# Patient Record
Sex: Male | Born: 1945 | Race: White | Hispanic: No | Marital: Married | Smoking: Former smoker
Health system: Southern US, Community
[De-identification: ages and names within clinical notes are randomized; demographics above are authoritative.]

## PROBLEM LIST (undated history)

## (undated) DIAGNOSIS — J42 Unspecified chronic bronchitis: Secondary | ICD-10-CM

## (undated) DIAGNOSIS — K589 Irritable bowel syndrome without diarrhea: Secondary | ICD-10-CM

## (undated) DIAGNOSIS — H9190 Unspecified hearing loss, unspecified ear: Secondary | ICD-10-CM

## (undated) DIAGNOSIS — K649 Unspecified hemorrhoids: Secondary | ICD-10-CM

## (undated) DIAGNOSIS — E785 Hyperlipidemia, unspecified: Secondary | ICD-10-CM

## (undated) DIAGNOSIS — M1990 Unspecified osteoarthritis, unspecified site: Secondary | ICD-10-CM

## (undated) DIAGNOSIS — Z860100 Personal history of colon polyps, unspecified: Secondary | ICD-10-CM

## (undated) DIAGNOSIS — R972 Elevated prostate specific antigen [PSA]: Secondary | ICD-10-CM

## (undated) DIAGNOSIS — I219 Acute myocardial infarction, unspecified: Secondary | ICD-10-CM

## (undated) DIAGNOSIS — J45909 Unspecified asthma, uncomplicated: Secondary | ICD-10-CM

## (undated) DIAGNOSIS — I10 Essential (primary) hypertension: Secondary | ICD-10-CM

## (undated) DIAGNOSIS — N400 Enlarged prostate without lower urinary tract symptoms: Secondary | ICD-10-CM

## (undated) DIAGNOSIS — T8859XA Other complications of anesthesia, initial encounter: Secondary | ICD-10-CM

## (undated) DIAGNOSIS — Z7902 Long term (current) use of antithrombotics/antiplatelets: Secondary | ICD-10-CM

## (undated) HISTORY — PX: CORONARY ANGIOPLASTY WITH STENT PLACEMENT: SHX49

## (undated) HISTORY — PX: BLEPHAROPLASTY: SUR158

## (undated) HISTORY — DX: Irritable bowel syndrome, unspecified: K58.9

## (undated) HISTORY — PX: TONSILLECTOMY: SUR1361

## (undated) HISTORY — DX: Unspecified hearing loss, unspecified ear: H91.90

## (undated) HISTORY — DX: Personal history of colon polyps, unspecified: Z86.0100

## (undated) HISTORY — DX: Personal history of colonic polyps: Z86.010

## (undated) HISTORY — PX: HEMORRHOID SURGERY: SHX153

## (undated) HISTORY — PX: COLONOSCOPY: SHX174

## (undated) HISTORY — DX: Unspecified asthma, uncomplicated: J45.909

---
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
## 1993-08-31 DIAGNOSIS — J45909 Unspecified asthma, uncomplicated: Secondary | ICD-10-CM

## 1993-08-31 HISTORY — DX: Unspecified asthma, uncomplicated: J45.909

## 2007-01-11 ENCOUNTER — Ambulatory Visit: Payer: Self-pay | Admitting: General Surgery

## 2008-03-29 ENCOUNTER — Ambulatory Visit: Payer: Self-pay | Admitting: Internal Medicine

## 2008-11-06 ENCOUNTER — Ambulatory Visit: Payer: Self-pay | Admitting: Internal Medicine

## 2008-11-07 ENCOUNTER — Ambulatory Visit: Payer: Self-pay | Admitting: Internal Medicine

## 2010-02-11 ENCOUNTER — Ambulatory Visit: Payer: Self-pay | Admitting: General Surgery

## 2010-08-31 HISTORY — PX: HEMORRHOID SURGERY: SHX153

## 2012-08-31 DIAGNOSIS — K631 Perforation of intestine (nontraumatic): Secondary | ICD-10-CM

## 2012-08-31 HISTORY — DX: Perforation of intestine (nontraumatic): K63.1

## 2013-01-02 ENCOUNTER — Encounter: Payer: Self-pay | Admitting: *Deleted

## 2013-02-23 ENCOUNTER — Ambulatory Visit: Payer: Self-pay | Admitting: General Surgery

## 2013-03-02 ENCOUNTER — Encounter: Payer: Self-pay | Admitting: *Deleted

## 2013-03-09 ENCOUNTER — Ambulatory Visit (INDEPENDENT_AMBULATORY_CARE_PROVIDER_SITE_OTHER): Payer: Medicare Other | Admitting: General Surgery

## 2013-03-09 ENCOUNTER — Encounter: Payer: Self-pay | Admitting: General Surgery

## 2013-03-09 VITALS — BP 130/78 | HR 78 | Resp 12 | Ht 70.5 in | Wt 189.0 lb

## 2013-03-09 DIAGNOSIS — Z860100 Personal history of colon polyps, unspecified: Secondary | ICD-10-CM | POA: Insufficient documentation

## 2013-03-09 MED ORDER — POLYETHYLENE GLYCOL 3350 17 GM/SCOOP PO POWD: ORAL | Status: DC

## 2013-03-09 NOTE — Patient Instructions (Addendum)
The patient is aware to call back for any questions or concerns.  Patient has been scheduled for a colonoscopy on 04-11-13 at [facility]. This patient has been asked to discontinue fish oil one week prior to procedure.

## 2013-03-09 NOTE — Progress Notes (Signed)
Patient ID: Corey Jimenez, male   DOB: 1946-06-30, 67 y.o.   MRN: [id number]  Chief Complaint  Patient presents with  . Follow-up    preop colonoscopy    HPI Corey Jimenez is a 67 y.o. male.  Patient here today for preop discussion for colonoscopy with a known history of colon polyps.Marland Kitchen His last colonoscopy was 02-11-10.    HPI  Past Medical History  Diagnosis Date  . Asthma 1995  . Personal history of colonic polyps   . Irritable bowel disease   . Hearing loss     mild    Past Surgical History  Procedure Laterality Date  . Colonoscopy  2008, 2011    Dr [facility]y Jimenez  . Tonsillectomy    . Hemorrhoid surgery      Family History  Problem Relation Age of Onset  . Parkinson's disease Father     Social History History  Substance Use Topics  . Smoking status: Former Smoker    Quit date: 08/31/1980  . Smokeless tobacco: Never Used  . Alcohol Use: Yes     Comment: 3-4/day    No Known Allergies  Current Outpatient Prescriptions  Medication Sig Dispense Refill  . aspirin 81 MG tablet Take 81 mg by mouth daily.      . fish oil-omega-3 fatty acids 1000 MG capsule Take 2 g by mouth daily.      . Multiple Vitamin (MULTIVITAMIN) capsule Take 1 capsule by mouth daily.      . Psyllium (METAMUCIL PO) Take 1 tablet by mouth daily.      . polyethylene glycol powder (GLYCOLAX/MIRALAX) powder 255 grams one bottle for colonoscopy prep  255 g  0   No current facility-administered medications for this visit.    Review of Systems Review of Systems  Constitutional: Negative.   Respiratory: Negative.   Cardiovascular: Negative.     Blood pressure 130/78, pulse 78, resp. rate 12, height 5' 10.5" (1.791 m), weight 189 lb (85.73 kg).  Physical Exam Physical Exam  Constitutional: He is oriented to person, place, and time. He appears well-developed and well-nourished.  Cardiovascular: Normal rate and regular rhythm.   Pulmonary/Chest: Effort normal and breath sounds normal.  Neurological: He is  alert and oriented to person, place, and time.  Skin: Skin is warm and dry.    Data Reviewed The patient's Additionalscreening colonoscopy in May 2008 showed 3 tubular adenomas of the ascending colon as well as an additional lesion of the descending colon. At the time of his June 2011 exam he had 3 mm polyps in the transverse colon and descending colon. These were all tubular adenoma was without evidence of high-grade dysplasia. He has been encouraged to have a 3 year followup exam.   Assessment    Colonic polyps.     Plan    Indication for a followup colonoscopy has been reviewed. Risks benefits are well familiar to the patient.     Patient has been scheduled for a colonoscopy on 04-11-13 at [facility]. This patient has been asked to discontinue fish oil one week prior to procedure.   Corey Jimenez 03/10/2013, 10:08 PM

## 2013-03-10 ENCOUNTER — Other Ambulatory Visit: Payer: Self-pay | Admitting: General Surgery

## 2013-03-10 ENCOUNTER — Encounter: Payer: Self-pay | Admitting: General Surgery

## 2013-03-10 DIAGNOSIS — Z86010 Personal history of colonic polyps: Secondary | ICD-10-CM

## 2013-04-03 ENCOUNTER — Telehealth: Payer: Self-pay | Admitting: *Deleted

## 2013-04-03 NOTE — Telephone Encounter (Signed)
Patient was contacted to verify medications. He reports no changes since last office visit and has already discontinued fish oil. We will proceed with colonoscopy that is scheduled at [facility] for 04-11-13. He was instructed to call if he has further questions.

## 2013-04-11 ENCOUNTER — Inpatient Hospital Stay: Payer: Self-pay | Admitting: General Surgery

## 2013-04-11 ENCOUNTER — Encounter: Payer: Self-pay | Admitting: General Surgery

## 2013-04-11 DIAGNOSIS — K631 Perforation of intestine (nontraumatic): Secondary | ICD-10-CM | POA: Insufficient documentation

## 2013-04-11 LAB — BASIC METABOLIC PANEL
Anion Gap: 5 — ABNORMAL LOW (ref 7–16)
Calcium, Total: 8.4 mg/dL — ABNORMAL LOW (ref 8.5–10.1)
Chloride: 107 mmol/L (ref 98–107)
Co2: 27 mmol/L (ref 21–32)
Sodium: 139 mmol/L (ref 136–145)

## 2013-04-11 LAB — CBC WITH DIFFERENTIAL/PLATELET
Basophil %: 0.3 %
Eosinophil #: 0.3 10*3/uL (ref 0.0–0.7)
Eosinophil %: 0 %
HCT: 43.1 % (ref 40.0–52.0)
HGB: 15.4 g/dL (ref 13.0–18.0)
Lymphocyte #: 1.8 10*3/uL (ref 1.0–3.6)
Lymphocyte #: 0.6 10*3/uL — ABNORMAL LOW (ref 1.0–3.6)
Lymphocyte %: 32.5 %
Lymphocyte %: 4.6 %
MCH: 31 pg (ref 26.0–34.0)
MCHC: 35.1 g/dL (ref 32.0–36.0)
MCHC: 35 g/dL (ref 32.0–36.0)
Monocyte #: 0.5 x10 3/mm (ref 0.2–1.0)
Neutrophil #: 2.8 10*3/uL (ref 1.4–6.5)
Neutrophil #: 11 10*3/uL — ABNORMAL HIGH (ref 1.4–6.5)
Platelet: 242 10*3/uL (ref 150–440)
RBC: 4.98 10*6/uL (ref 4.40–5.90)
RBC: 4.92 10*6/uL (ref 4.40–5.90)
RDW: 13.1 % (ref 11.5–14.5)
WBC: 5.5 10*3/uL (ref 3.8–10.6)

## 2013-04-12 ENCOUNTER — Encounter: Payer: Self-pay | Admitting: General Surgery

## 2013-04-12 LAB — CBC WITH DIFFERENTIAL/PLATELET
Basophil #: 0 10*3/uL (ref 0.0–0.1)
Basophil %: 0.5 %
Eosinophil #: 0.1 10*3/uL (ref 0.0–0.7)
Eosinophil %: 1 %
Eosinophil %: 0.9 %
Lymphocyte #: 1 10*3/uL (ref 1.0–3.6)
Lymphocyte %: 7.6 %
Lymphocyte %: 8 %
MCH: 31 pg (ref 26.0–34.0)
MCV: 88 fL (ref 80–100)
MCV: 88 fL (ref 80–100)
Monocyte #: 0.6 x10 3/mm (ref 0.2–1.0)
Platelet: 205 10*3/uL (ref 150–440)
RBC: 4.61 10*6/uL (ref 4.40–5.90)
RBC: 4.51 10*6/uL (ref 4.40–5.90)
RDW: 13.6 % (ref 11.5–14.5)
RDW: 13.6 % (ref 11.5–14.5)
WBC: 13.4 10*3/uL — ABNORMAL HIGH (ref 3.8–10.6)

## 2013-04-12 LAB — BASIC METABOLIC PANEL
Anion Gap: 5 — ABNORMAL LOW (ref 7–16)
BUN: 12 mg/dL (ref 7–18)
Chloride: 106 mmol/L (ref 98–107)
Co2: 29 mmol/L (ref 21–32)
Osmolality: 280 (ref 275–301)
Potassium: 3.7 mmol/L (ref 3.5–5.1)
Sodium: 140 mmol/L (ref 136–145)

## 2013-04-13 LAB — CBC WITH DIFFERENTIAL/PLATELET
Eosinophil #: 0.3 10*3/uL (ref 0.0–0.7)
HCT: 38.9 % — ABNORMAL LOW (ref 40.0–52.0)
HGB: 13.8 g/dL (ref 13.0–18.0)
Lymphocyte %: 10.7 %
Monocyte %: 5.7 %
Platelet: 194 10*3/uL (ref 150–440)
RDW: 13.5 % (ref 11.5–14.5)
WBC: 9.4 10*3/uL (ref 3.8–10.6)

## 2013-04-14 LAB — CBC WITH DIFFERENTIAL/PLATELET
Basophil #: 0 10*3/uL (ref 0.0–0.1)
Eosinophil #: 0.6 10*3/uL (ref 0.0–0.7)
Eosinophil %: 7.3 %
HGB: 13.5 g/dL (ref 13.0–18.0)
Lymphocyte #: 1 10*3/uL (ref 1.0–3.6)
MCH: 31.1 pg (ref 26.0–34.0)
MCHC: 35.6 g/dL (ref 32.0–36.0)
MCV: 87 fL (ref 80–100)
Monocyte #: 0.6 x10 3/mm (ref 0.2–1.0)
Monocyte %: 7.7 %
Neutrophil %: 71.9 %
Platelet: 208 10*3/uL (ref 150–440)
RBC: 4.34 10*6/uL — ABNORMAL LOW (ref 4.40–5.90)
WBC: 7.7 10*3/uL (ref 3.8–10.6)

## 2013-04-17 ENCOUNTER — Encounter: Payer: Self-pay | Admitting: General Surgery

## 2013-04-18 ENCOUNTER — Ambulatory Visit: Payer: Medicare Other | Admitting: General Surgery

## 2013-04-18 ENCOUNTER — Encounter: Payer: Self-pay | Admitting: General Surgery

## 2013-04-18 VITALS — BP 118/82 | HR 72 | Resp 16 | Ht 70.0 in | Wt 182.0 lb

## 2013-04-18 DIAGNOSIS — K631 Perforation of intestine (nontraumatic): Secondary | ICD-10-CM

## 2013-04-18 DIAGNOSIS — Z86010 Personal history of colonic polyps: Secondary | ICD-10-CM

## 2013-04-18 DIAGNOSIS — K5731 Diverticulosis of large intestine without perforation or abscess with bleeding: Secondary | ICD-10-CM

## 2013-04-18 NOTE — Patient Instructions (Addendum)
The patient is free to continue regular medications and diet. Patient to return in 3 months.

## 2013-04-18 NOTE — Progress Notes (Signed)
Patient ID: Corey Jimenez, male   DOB: [birth year], 67 y.o.   MRN: [id number]  Chief Complaint  Patient presents with  . Routine Post Op    pref colon    HPI Corey Jimenez is a 67 y.o. male here today for his post op colonoscopy done 04/12/13. Patient experienced perforation of the sigmoid colon into the mesentery during the procedure.The patient was treated conservatively with IV antibiotics. CT scan the day after the procedure showed no evidence of free contrast leak. Diffuse air in the retroperitoneum. No abdominal symptoms. Temperature spike the evening after surgery with resolution and normal white blood cell count on postprocedure day 2.He denies anything new problems since his hospital stay. Bowels are working fine at this time. The patient has one more day of Augmentin to take. The patient reports his stools were we'll softer than prior to the procedure, but he has not resumed his normal Metamucil.  HPI  Past Medical History  Diagnosis Date  . Asthma 1995  . Personal history of colonic polyps   . Irritable bowel disease   . Hearing loss     mild    Past Surgical History  Procedure Laterality Date  . Colonoscopy  2008, 2011,2014    Dr Lemar Livings  . Tonsillectomy    . Hemorrhoid surgery      Family History  Problem Relation Age of Onset  . Parkinson's disease Father     Social History History  Substance Use Topics  . Smoking status: Former Smoker    Quit date: 08/31/1980  . Smokeless tobacco: Never Used  . Alcohol Use: Yes     Comment: 3-4/day    No Known Allergies  Current Outpatient Prescriptions  Medication Sig Dispense Refill  . amoxicillin-clavulanate (AUGMENTIN) 875-125 MG per tablet Take 1 tablet by mouth 2 (two) times daily.       No current facility-administered medications for this visit.    Review of Systems Review of Systems  Constitutional: Negative.   Respiratory: Negative.   Cardiovascular: Negative.   Gastrointestinal: Negative.     Blood pressure  118/82, pulse 72, resp. rate 16, height 5\' 10"  (1.778 m), weight 182 lb (82.555 kg).  Physical Exam Physical Exam  Constitutional: He is oriented to person, place, and time. He appears well-developed and well-nourished.  Neck: No thyromegaly present.  Cardiovascular: Normal rate, regular rhythm and normal heart sounds.   No murmur heard. Pulmonary/Chest: Effort normal and breath sounds normal.  Abdominal: Soft. Normal appearance and bowel sounds are normal. There is no tenderness.  Lymphadenopathy:    He has no cervical adenopathy.  Neurological: He is alert and oriented to person, place, and time.  Skin: Skin is warm and dry.    Data Reviewed The colonoscope was only advanced to the descending/sigmoid junction. Diffuse diverticulosis was identified. No colonic polyps in this area. (Previous endoscopies that showed polyps in the right side of the colon) sutures.  Assessment    Iatrogenic colon perforation, resolved nicely with conservative treatment.     Plan    Will arrange for a stool DNA test to detect adenomatous changes and the polyp in approximately 3 months. If positive, virtual colonoscopy will be recommended.        Corey Jimenez 04/19/2013, 8:42 PM

## 2013-04-19 ENCOUNTER — Encounter: Payer: Self-pay | Admitting: General Surgery

## 2013-04-19 DIAGNOSIS — K5731 Diverticulosis of large intestine without perforation or abscess with bleeding: Secondary | ICD-10-CM | POA: Insufficient documentation

## 2013-05-04 ENCOUNTER — Encounter: Payer: Self-pay | Admitting: General Surgery

## 2013-06-30 ENCOUNTER — Other Ambulatory Visit: Payer: Self-pay | Admitting: General Surgery

## 2013-08-21 ENCOUNTER — Telehealth: Payer: Self-pay | Admitting: General Surgery

## 2013-08-21 NOTE — Telephone Encounter (Signed)
Stool genetic testing not appropriately validated for patient's with known polyps or at increased risk of polyps.   Would recommend at CT colonoscopy be completed when convenient for the patient.

## 2013-08-23 NOTE — Telephone Encounter (Signed)
Message left for patient to call the office since we have not heard back from him.

## 2013-09-04 ENCOUNTER — Telehealth: Payer: Self-pay | Admitting: General Surgery

## 2013-09-04 NOTE — Telephone Encounter (Signed)
Contacted patient re: incomplete colonoscopy of April 11, 9378 complicated by perforation into the mesentery.  Resolved without operative intervention. Had originally thought he would be a candidate for Cologuard stool analysis, but their website has not validated the test in patients with known polyps in the past. (He had three tubular adenomas in the ascending colon at the time of his 2011 exam.)  He is undecided about whether he wants to investigate any further for additional polyps (he had also developed a cough after his August 2014 exam likely related to sedation.)  I have recommended he consider a CT colonoscopy. While this requires a prep, as well as air insufflation, the risk of injury is significantly less.    He has an appointment with his PCP Benita Stabile, MD) next month, and will discuss with him the advisability of additional testing.  He will notify me of his decision.

## 2014-11-11 IMAGING — CR DG CHEST 1V PORT
1 series · 1 of 1 positions shown · non-contrast
Comparison: none

REASON FOR EXAM: ERECT/  colon perforation s/p endoscoopy.  Patient in
endo unit.
COMMENTS:

[ap]
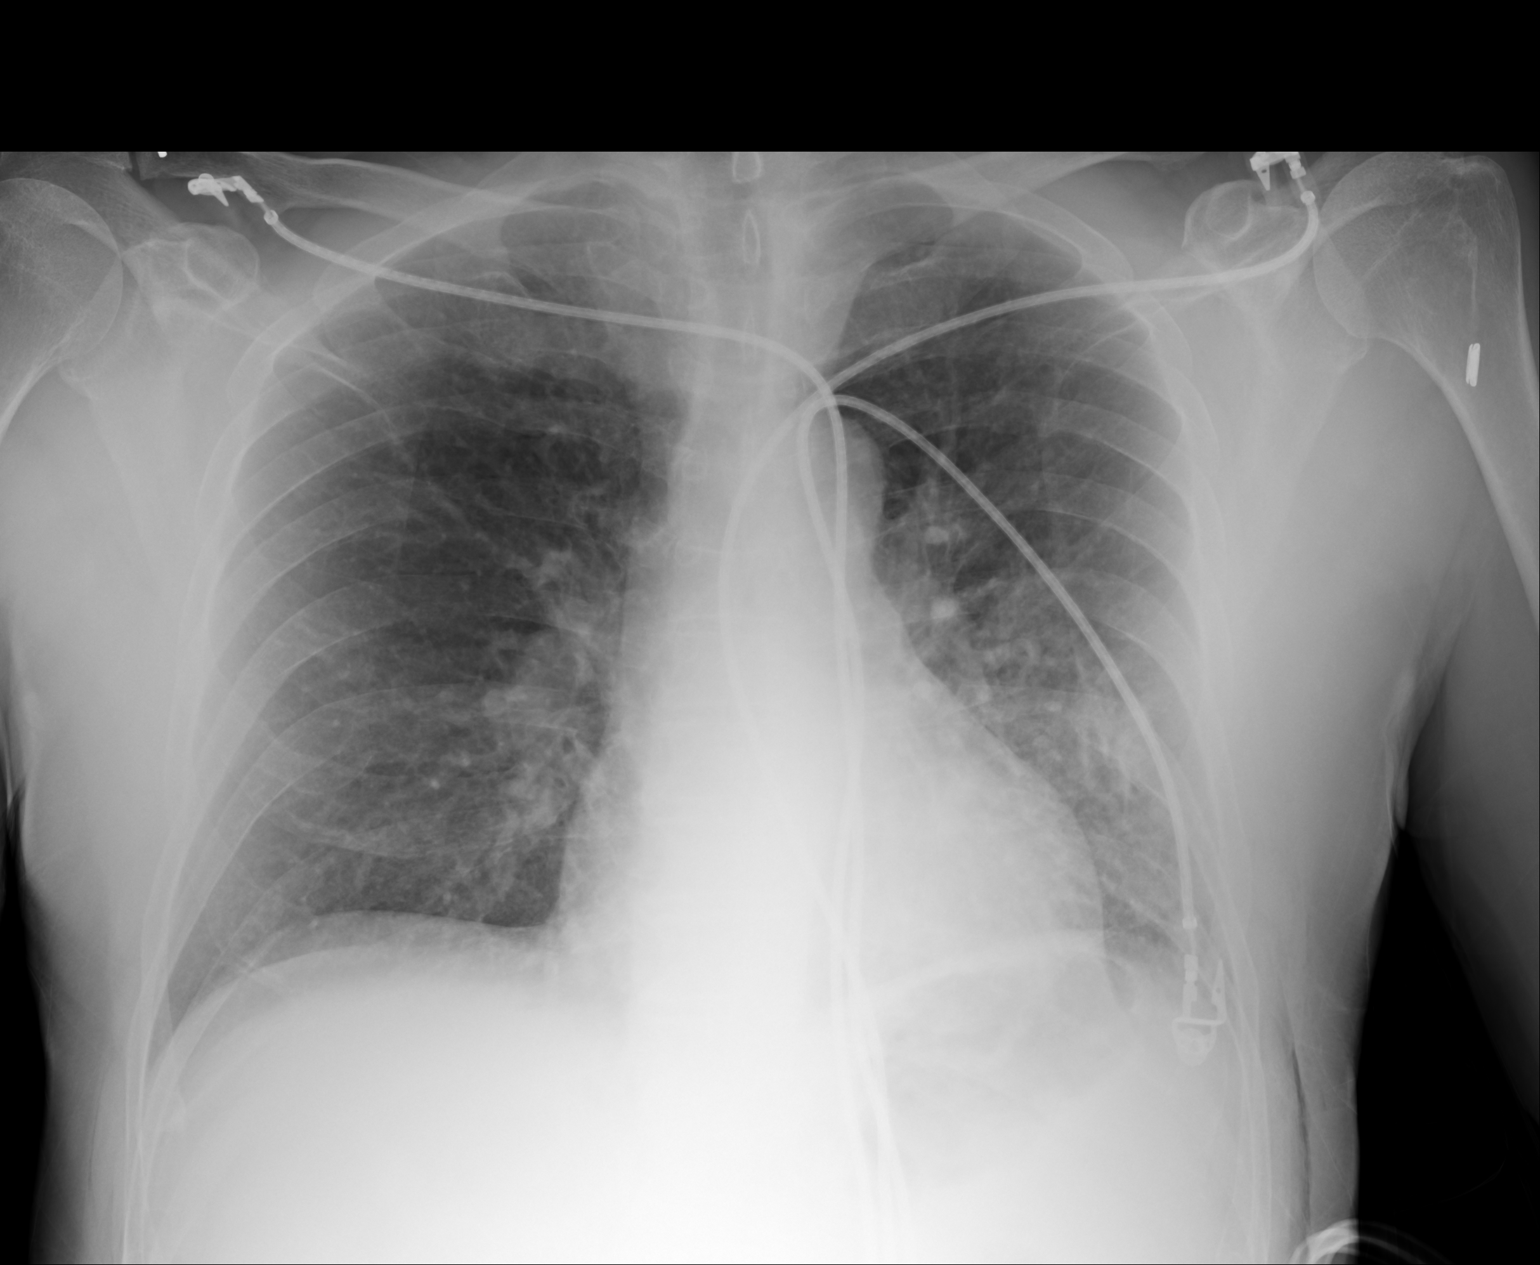

[1 of 1 positions shown; findings below may reference images not displayed]

PROCEDURE:     DXR - DXR PORTABLE CHEST SINGLE VIEW  - April 11, 2013  [DATE]

RESULT:     The lungs are adequately inflated. There is hazy density in the
inferior aspect of the left lung which may reflect subsegmental atelectasis.
The cardiac silhouette is not enlarged. The mediastinum is normal in width.
The central pulmonary vascularity is prominent.

No definite free subdiaphragmatic gas is demonstrated. There is some
scalloping of the right hemidiaphragm which dates exclusion of small amounts
of free air difficult.
IMPRESSION: 1. Findings suggest atelectasis in the inferior aspect of the left lung.
2. No definite free gas collections are demonstrated. A three-way abdominal
series or abdominal CT scan is recommended if perforation is suspected.

[REDACTED]

## 2018-09-26 ENCOUNTER — Ambulatory Visit
Admission: RE | Admit: 2018-09-26 | Discharge: 2018-09-26 | Disposition: A | Discharge status: Home / Self Care | Payer: Medicare HMO | Source: Ambulatory Visit | Attending: Adult Health | Admitting: Adult Health

## 2018-09-26 ENCOUNTER — Encounter: Payer: Self-pay | Admitting: Internal Medicine

## 2018-09-26 ENCOUNTER — Ambulatory Visit
Admission: RE | Admit: 2018-09-26 | Discharge: 2018-09-26 | Disposition: A | Discharge status: Home / Self Care | Payer: Medicare HMO | Attending: Internal Medicine | Admitting: Internal Medicine

## 2018-09-26 ENCOUNTER — Ambulatory Visit: Payer: Medicare HMO | Admitting: Internal Medicine

## 2018-09-26 VITALS — BP 129/84 | HR 66 | Resp 16 | Ht 70.0 in | Wt 191.8 lb

## 2018-09-26 DIAGNOSIS — J4520 Mild intermittent asthma, uncomplicated: Secondary | ICD-10-CM

## 2018-09-26 DIAGNOSIS — J449 Chronic obstructive pulmonary disease, unspecified: Secondary | ICD-10-CM

## 2018-09-26 MED ORDER — ALBUTEROL SULFATE HFA 108 (90 BASE) MCG/ACT IN AERS: 2.0000 | INHALATION_SPRAY | Freq: Four times a day (QID) | RESPIRATORY_TRACT | 2 refills | Status: DC | PRN

## 2018-09-26 NOTE — Patient Instructions (Signed)
Chronic Obstructive Pulmonary Disease Chronic obstructive pulmonary disease (COPD) is a long-term (chronic) lung problem. When you have COPD, it is hard for air to get in and out of your lungs. Usually the condition gets worse over time, and your lungs will never return to normal. There are things you can do to keep yourself as healthy as possible.  Your doctor may treat your condition with: ? Medicines. ? Oxygen. ? Lung surgery.  Your doctor may also recommend: ? Rehabilitation. This includes steps to make your body work better. It may involve a team of specialists. ? Quitting smoking, if you smoke. ? Exercise and changes to your diet. ? Comfort measures (palliative care). Follow these instructions at home: Medicines  Take over-the-counter and prescription medicines only as told by your doctor.  Talk to your doctor before taking any cough or allergy medicines. You may need to avoid medicines that cause your lungs to be dry. Lifestyle  If you smoke, stop. Smoking makes the problem worse. If you need help quitting, ask your doctor.  Avoid being around things that make your breathing worse. This may include smoke, chemicals, and fumes.  Stay active, but remember to rest as well.  Learn and use tips on how to relax.  Make sure you get enough sleep. Most adults need at least 7 hours of sleep every night.  Eat healthy foods. Eat smaller meals more often. Rest before meals. Controlled breathing Learn and use tips on how to control your breathing as told by your doctor. Try:  Breathing in (inhaling) through your nose for 1 second. Then, pucker your lips and breath out (exhale) through your lips for 2 seconds.  Putting one hand on your belly (abdomen). Breathe in slowly through your nose for 1 second. Your hand on your belly should move out. Pucker your lips and breathe out slowly through your lips. Your hand on your belly should move in as you breathe out.  Controlled coughing Learn  and use controlled coughing to clear mucus from your lungs. Follow these steps: 1. Lean your head a little forward. 2. Breathe in deeply. 3. Try to hold your breath for 3 seconds. 4. Keep your mouth slightly open while coughing 2 times. 5. Spit any mucus out into a tissue. 6. Rest and do the steps again 1 or 2 times as needed. General instructions  Make sure you get all the shots (vaccines) that your doctor recommends. Ask your doctor about a flu shot and a pneumonia shot.  Use oxygen therapy and pulmonary rehabilitation if told by your doctor. If you need home oxygen therapy, ask your doctor if you should buy a tool to measure your oxygen level (oximeter).  Make a COPD action plan with your doctor. This helps you to know what to do if you feel worse than usual.  Manage any other conditions you have as told by your doctor.  Avoid going outside when it is very hot, cold, or humid.  Avoid people who have a sickness you can catch (contagious).  Keep all follow-up visits as told by your doctor. This is important. Contact a doctor if:  You cough up more mucus than usual.  There is a change in the color or thickness of the mucus.  It is harder to breathe than usual.  Your breathing is faster than usual.  You have trouble sleeping.  You need to use your medicines more often than usual.  You have trouble doing your normal activities such as getting dressed   or walking around the house. Get help right away if:  You have shortness of breath while resting.  You have shortness of breath that stops you from: ? Being able to talk. ? Doing normal activities.  Your chest hurts for longer than 5 minutes.  Your skin color is more blue than usual.  Your pulse oximeter shows that you have low oxygen for longer than 5 minutes.  You have a fever.  You feel too tired to breathe normally. Summary  Chronic obstructive pulmonary disease (COPD) is a long-term lung problem.  The way your  lungs work will never return to normal. Usually the condition gets worse over time. There are things you can do to keep yourself as healthy as possible.  Take over-the-counter and prescription medicines only as told by your doctor.  If you smoke, stop. Smoking makes the problem worse. This information is not intended to replace advice given to you by your health care provider. Make sure you discuss any questions you have with your health care provider. Document Released: 02/03/2008 Document Revised: 09/21/2016 Document Reviewed: 09/21/2016 Elsevier Interactive Patient Education  2019 Elsevier Inc.  

## 2018-09-26 NOTE — Progress Notes (Signed)
[facility] [place], [place] [id number]  Pulmonary Sleep Medicine   Office Visit Note  Patient Name: Corey Jimenez DOB: February 25, 1946 MRN [id number]  Date of Service: 09/26/2018  Complaints/HPI: Patient is here to establish care with pulmonology.  Patient reports a history of mild asthma without complications as well as COPD.  He currently uses an albuterol rescue inhaler as needed.  He also intermittently uses guaifenesin with codeine cough syrup at times to help him sleep due to his cough.  He is requesting a refill of his albuterol inhaler at this time.  Overall generally he reports he is doing well he would just like to reestablish care with pulmonology at this time.  ROS  General: (-) fever, (-) chills, (-) night sweats, (-) weakness Skin: (-) rashes, (-) itching,. Eyes: (-) visual changes, (-) redness, (-) itching. Nose and Sinuses: (-) nasal stuffiness or itchiness, (-) postnasal drip, (-) nosebleeds, (-) sinus trouble. Mouth and Throat: (-) sore throat, (-) hoarseness. Neck: (-) swollen glands, (-) enlarged thyroid, (-) neck pain. Respiratory: - cough, (-) bloody sputum, - shortness of breath, - wheezing. Cardiovascular: - ankle swelling, (-) chest pain. Lymphatic: (-) lymph node enlargement. Neurologic: (-) numbness, (-) tingling. Psychiatric: (-) anxiety, (-) depression   Current Medication: Outpatient Encounter Medications as of 09/26/2018  Medication Sig  . albuterol (PROVENTIL HFA;VENTOLIN HFA) 108 (90 Base) MCG/ACT inhaler Inhale 2 puffs into the lungs every 6 (six) hours as needed for wheezing or shortness of breath.  . guaiFENesin-codeine (ROBITUSSIN AC) 100-10 MG/5ML syrup Take 5 mLs by mouth as needed for cough.  . [DISCONTINUED] albuterol (PROVENTIL HFA;VENTOLIN HFA) 108 (90 Base) MCG/ACT inhaler Inhale 2 puffs into the lungs every 6 (six) hours as needed for wheezing or shortness of breath.  Marland Kitchen amoxicillin-clavulanate (AUGMENTIN) 875-125 MG  per tablet Take 1 tablet by mouth 2 (two) times daily.   No facility-administered encounter medications on file as of 09/26/2018.     Surgical History: Past Surgical History:  Procedure Laterality Date  . COLONOSCOPY  2008, 2011,2014   Dr Bary Castilla  . HEMORRHOID SURGERY    . TONSILLECTOMY      Medical History: Past Medical History:  Diagnosis Date  . Asthma 1995  . Hearing loss    mild  . Irritable bowel disease   . Personal history of colonic polyps     Family History: Family History  Problem Relation Age of Onset  . Parkinson's disease Father     Social History: Social History   Socioeconomic History  . Marital status: Married    Spouse name: Not on file  . Number of children: Not on file  . Years of education: Not on file  . Highest education level: Not on file  Occupational History  . Not on file  Social Needs  . Financial resource strain: Not on file  . Food insecurity:    Worry: Not on file    Inability: Not on file  . Transportation needs:    Medical: Not on file    Non-medical: Not on file  Tobacco Use  . Smoking status: Former Smoker    Last attempt to quit: 08/31/1980    Years since quitting: 38.0  . Smokeless tobacco: Former Network engineer and Sexual Activity  . Alcohol use: Yes    Comment: 3-4/day  . Drug use: No  . Sexual activity: Not on file  Lifestyle  . Physical activity:    Days per week: Not on file  Minutes per session: Not on file  . Stress: Not on file  Relationships  . Social connections:    Talks on phone: Not on file    Gets together: Not on file    Attends religious service: Not on file    Active member of club or organization: Not on file    Attends meetings of clubs or organizations: Not on file    Relationship status: Not on file  . Intimate partner violence:    Fear of current or ex partner: Not on file    Emotionally abused: Not on file    Physically abused: Not on file    Forced sexual activity: Not on file   Other Topics Concern  . Not on file  Social History Narrative  . Not on file    Vital Signs: Blood pressure 129/84, pulse 66, resp. rate 16, height 5\' 10"  (1.778 m), weight 191 lb 12.8 oz (87 kg), SpO2 98 %.  Examination: General Appearance: The patient is well-developed, well-nourished, and in no distress. Skin: Gross inspection of skin unremarkable. Head: normocephalic, no gross deformities. Eyes: no gross deformities noted. ENT: ears appear grossly normal no exudates. Neck: Supple. No thyromegaly. No LAD. Respiratory: clear . Cardiovascular: Normal S1 and S2 without murmur or rub. Extremities: No cyanosis. pulses are equal. Neurologic: Alert and oriented. No involuntary movements.  LABS: No results found for this or any previous visit (from the past 2160 hour(s)).  Radiology: Ct Abdomen Pelvis W ([facility] Hx)  Result Date: 04/12/2013 * PRIOR REPORT IMPORTED FROM AN EXTERNAL SYSTEM * PRIOR REPORT IMPORTED FROM THE [facility] WORKFLOW SYSTEM REASON FOR EXAM:    (1) Colon perforation at colonoscopy; (2) Same.  LONG SOAK. CALL REPORT COMMENTS: PROCEDURE:     CT  - CT ABDOMEN / PELVIS  W  - Apr 12 2013  1:00PM RESULT:     CT abdomen pelvis dated 04/12/2013. Technique: Helical 3 mm sections were obtained from the lung bases the pubic symphysis status post intravenous ministration of 100 mL of Isovue-370 and oral contrast. Findings: Areas of lucency project within the right left the lung bases posteriorly left greater than right. The liver, spleen, adrenals, pancreas, kidneys are unremarkable. Mild posterior aspect of the cardia of the stomach fluid in [place], contrast field collections appreciated. This appears to communicate with the stomach as the appearance of cyst. Stomach is otherwise unremarkable. Air is appreciated tracking within the retroperitoneal region to the level of the esophageal hiatus and the left and right. A small punctate foci of air appreciated in the intraperitoneal abdominal  cavity. The greatest confluence is no central portion of the pelvis. Foci of air also appreciated tracking along the anterior abdominal wall deep to the subcutaneous tissues as well as the to the rectus abdominal musculature bilaterally. Signs consistent with a history of colonic perforation. Is no evidence of contrast extravasation. There is no evidence of abdominal aortic aneurysm nor dissection. IMPRESSION:      Retroperitoneal, intraperitoneal and areas of subcutaneous air appreciated within the abdomen. These findings are consistent patient's history of colonic perforation. Is no evidence of drainable loculated fluid collections nor oral contrast extravasation as described above. 2. Atelectasis versus infiltrate the lung bases. 3. Gastric diverticulum    Dg Chest Port 1 View  Result Date: 04/12/2013 * PRIOR REPORT IMPORTED FROM AN EXTERNAL SYSTEM * PRIOR REPORT IMPORTED FROM THE [facility] WORKFLOW SYSTEM REASON FOR EXAM:    F/U colonic perforation/ PLEASE COMPLETE ERECT. COMMENTS: PROCEDURE:  DXR - DXR PORTABLE CHEST SINGLE VIEW  - Apr 12 2013  6:24AM RESULT:     The lungs are clear. The cardiac silhouette and visualized bony skeleton are unremarkable. IMPRESSION: 1. Chest radiograph without evidence of acute cardiopulmonary disease. 2. Comparison made to prior study dated 04/11/2013.    Dg Chest Port 1 View  Result Date: 04/11/2013 * PRIOR REPORT IMPORTED FROM AN EXTERNAL SYSTEM * PRIOR REPORT IMPORTED FROM THE [facility] WORKFLOW SYSTEM REASON FOR EXAM:    ERECT/  colon perforation s/p endoscoopy.  Patient in endo unit. COMMENTS: PROCEDURE:     DXR - DXR PORTABLE CHEST SINGLE VIEW  - Apr 11 2013  2:25PM RESULT:     The lungs are adequately inflated. There is hazy density in the inferior aspect of the left lung which may reflect subsegmental atelectasis. The cardiac silhouette is not enlarged. The mediastinum is normal in width. The central pulmonary vascularity is prominent. No definite free subdiaphragmatic  gas is demonstrated. There is some scalloping of the right hemidiaphragm which dates exclusion of small amounts of free air difficult. IMPRESSION: 1. Findings suggest atelectasis in the inferior aspect of the left lung. 2. No definite free gas collections are demonstrated. A three-way abdominal series or abdominal CT scan is recommended if perforation is suspected. Dictation Site: 2     No results found.  No results found.    Assessment and Plan: Patient Active Problem List   Diagnosis Date Noted  . Diverticulosis of colon with hemorrhage 04/19/2013  . Perforation of colon as colonoscopy complication ([place]) 38/[id number]/6599  . Personal history of colonic polyps 03/09/2013   1. Mild intermittent asthma without complication Refilled patient's albuterol inhaler at his request.  Also ordered pulmonary function test and chest x-ray to evaluate patient's current status.  We will follow-up in a few months with results. - albuterol (PROVENTIL HFA;VENTOLIN HFA) 108 (90 Base) MCG/ACT inhaler; Inhale 2 puffs into the lungs every 6 (six) hours as needed for wheezing or shortness of breath.  Dispense: 3 Inhaler; Refill: 2 - Pulmonary Function Test; Future - DG Chest 2 View; Future  2. Chronic obstructive pulmonary disease, unspecified COPD type ([place]) Post x-ray ordered.  Patient is using only albuterol rescue inhaler as needed.  Overall he states his breathing is doing well at this time. - DG Chest 2 View; Future   General Counseling: I have discussed the findings of the evaluation and examination with [place].  I have also discussed any further diagnostic evaluation thatmay be needed or ordered today. Brett verbalizes understanding of the findings of todays visit. We also reviewed his medications today and discussed drug interactions and side effects including but not limited excessive drowsiness and altered mental states. We also discussed that there is always a risk not just to him but also people around him.  he has been encouraged to call the office with any questions or concerns that should arise related to todays visit.    Time spent: 25  I have personally obtained a history, examined the patient, evaluated laboratory and imaging results, formulated the assessment and plan and placed orders.    Allyne Gee, MD [facility] Pulmonary and Critical Care Sleep medicine

## 2018-10-06 ENCOUNTER — Ambulatory Visit: Payer: Self-pay | Admitting: Internal Medicine

## 2018-10-26 ENCOUNTER — Ambulatory Visit (INDEPENDENT_AMBULATORY_CARE_PROVIDER_SITE_OTHER): Payer: Medicare HMO | Admitting: Internal Medicine

## 2018-10-26 DIAGNOSIS — J4520 Mild intermittent asthma, uncomplicated: Secondary | ICD-10-CM

## 2018-10-26 LAB — PULMONARY FUNCTION TEST

## 2018-10-27 ENCOUNTER — Encounter: Payer: Self-pay | Admitting: Internal Medicine

## 2018-10-27 ENCOUNTER — Ambulatory Visit: Payer: Medicare HMO | Admitting: Internal Medicine

## 2018-10-27 VITALS — BP 142/78 | HR 62 | Resp 16 | Ht 70.0 in | Wt 194.0 lb

## 2018-10-27 DIAGNOSIS — J4520 Mild intermittent asthma, uncomplicated: Secondary | ICD-10-CM

## 2018-10-27 NOTE — Progress Notes (Signed)
[facility] [place], [place] [address]  Pulmonary Sleep Medicine   Office Visit Note  Patient Name: Corey Jimenez DOB: 1946/01/27 MRN [id number]  Date of Service: 10/27/2018  Complaints/HPI: Patient is here to review results of pulmonary function test as well as chest x-ray.  His chest x-ray reveals no opacities or effusions.  It shows no active cardiopulmonary disease.  His PFT shows a normal FEV1.  His lung capacity has a normal residual volume.  His DLCO is been normal limits.  Patient was advised to see pulmonology for work-up due to some shortness of breath he is having.  He reports that that is resolved now and he is doing well.  ROS  General: (-) fever, (-) chills, (-) night sweats, (-) weakness Skin: (-) rashes, (-) itching,. Eyes: (-) visual changes, (-) redness, (-) itching. Nose and Sinuses: (-) nasal stuffiness or itchiness, (-) postnasal drip, (-) nosebleeds, (-) sinus trouble. Mouth and Throat: (-) sore throat, (-) hoarseness. Neck: (-) swollen glands, (-) enlarged thyroid, (-) neck pain. Respiratory: - cough, (-) bloody sputum, - shortness of breath, - wheezing. Cardiovascular: - ankle swelling, (-) chest pain. Lymphatic: (-) lymph node enlargement. Neurologic: (-) numbness, (-) tingling. Psychiatric: (-) anxiety, (-) depression   Current Medication: Outpatient Encounter Medications as of 10/27/2018  Medication Sig  . albuterol (PROVENTIL HFA;VENTOLIN HFA) 108 (90 Base) MCG/ACT inhaler Inhale 2 puffs into the lungs every 6 (six) hours as needed for wheezing or shortness of breath. (Patient not taking: Reported on 10/27/2018)  . [DISCONTINUED] amoxicillin-clavulanate (AUGMENTIN) 875-125 MG per tablet Take 1 tablet by mouth 2 (two) times daily.  . [DISCONTINUED] guaiFENesin-codeine (ROBITUSSIN AC) 100-10 MG/5ML syrup Take 5 mLs by mouth as needed for cough.   No facility-administered encounter medications on file as of 10/27/2018.     Surgical  History: Past Surgical History:  Procedure Laterality Date  . COLONOSCOPY  2008, 2011,2014   Dr Bary Castilla  . HEMORRHOID SURGERY    . TONSILLECTOMY      Medical History: Past Medical History:  Diagnosis Date  . Asthma 1995  . Hearing loss    mild  . Irritable bowel disease   . Personal history of colonic polyps     Family History: Family History  Problem Relation Age of Onset  . Parkinson's disease Father     Social History: Social History   Socioeconomic History  . Marital status: Married    Spouse name: Not on file  . Number of children: Not on file  . Years of education: Not on file  . Highest education level: Not on file  Occupational History  . Not on file  Social Needs  . Financial resource strain: Not on file  . Food insecurity:    Worry: Not on file    Inability: Not on file  . Transportation needs:    Medical: Not on file    Non-medical: Not on file  Tobacco Use  . Smoking status: Former Smoker    Last attempt to quit: 08/31/1980    Years since quitting: 38.1  . Smokeless tobacco: Former Network engineer and Sexual Activity  . Alcohol use: Yes    Comment: 3-4/day  . Drug use: No  . Sexual activity: Not on file  Lifestyle  . Physical activity:    Days per week: Not on file    Minutes per session: Not on file  . Stress: Not on file  Relationships  . Social connections:  Talks on phone: Not on file    Gets together: Not on file    Attends religious service: Not on file    Active member of club or organization: Not on file    Attends meetings of clubs or organizations: Not on file    Relationship status: Not on file  . Intimate partner violence:    Fear of current or ex partner: Not on file    Emotionally abused: Not on file    Physically abused: Not on file    Forced sexual activity: Not on file  Other Topics Concern  . Not on file  Social History Narrative  . Not on file    Vital Signs: Blood pressure (!) 142/78, pulse 62, resp. rate  16, height 5\' 10"  (1.778 m), weight 194 lb (88 kg), SpO2 96 %.  Examination: General Appearance: The patient is well-developed, well-nourished, and in no distress. Skin: Gross inspection of skin unremarkable. Head: normocephalic, no gross deformities. Eyes: no gross deformities noted. ENT: ears appear grossly normal no exudates. Neck: Supple. No thyromegaly. No LAD. Respiratory: clear bilateraly. Cardiovascular: Normal S1 and S2 without murmur or rub. Extremities: No cyanosis. pulses are equal. Neurologic: Alert and oriented. No involuntary movements.  LABS: No results found for this or any previous visit (from the past 2160 hour(s)).  Radiology: Dg Chest 2 View  Result Date: 09/26/2018 CLINICAL DATA:  Cough, congestion, shortness of Breath EXAM: CHEST - 2 VIEW COMPARISON:  04/12/2013 FINDINGS: Heart and mediastinal contours are within normal limits. No focal opacities or effusions. No acute bony abnormality. Old left 8th rib fracture again noted, unchanged. IMPRESSION: No active cardiopulmonary disease. Electronically Signed   By: Rolm Baptise M.D.   On: 09/26/2018 16:29    No results found.  No results found.    Assessment and Plan: Patient Active Problem List   Diagnosis Date Noted  . Diverticulosis of colon with hemorrhage 04/19/2013  . Perforation of colon as colonoscopy complication ([place]) 11/94/1740  . Personal history of colonic polyps 03/09/2013   1. Mild intermittent asthma without complication Patient is doing well.  Advised him to follow-up with [place] in 6 months to a year.  Patient declined to make appointment at this time and reports he always if he needs [place].   General Counseling: I have discussed the findings of the evaluation and examination with Corey Jimenez.  I have also discussed any further diagnostic evaluation thatmay be needed or ordered today. Corey Jimenez verbalizes understanding of the findings of todays visit. We also reviewed his medications today and discussed drug  interactions and side effects including but not limited excessive drowsiness and altered mental states. We also discussed that there is always a risk not just to him but also people around him. he has been encouraged to call the office with any questions or concerns that should arise related to todays visit.    Time spent: 25 This patient was seen by Orson Gear AGNP-C in Collaboration with Dr. Devona Konig as a part of collaborative care agreement.   I have personally obtained a history, examined the patient, evaluated laboratory and imaging results, formulated the assessment and plan and placed orders.    Allyne Gee, MD [facility] Pulmonary and Critical Care Sleep medicine

## 2018-10-28 NOTE — Procedures (Signed)
[place], [id number]  DATE OF SERVICE: October 26, 2018  Complete Pulmonary Function Testing Interpretation:  FINDINGS:  The forced vital capacity is normal the FEV1 is normal the FEV1 Ossey ratio is mildly decreased.  Postbronchodilator there is no significant change in the FEV1.  Total lung capacity is normal residual volume is decreased residual volume total lung capacity ratio is decreased total gas volume is is mildly decreased.Marland Kitchen  DLCO is within normal limits  IMPRESSION:  This pulmonary function study is within normal limits.  Clinical correlation is recommended  Allyne Gee, MD [facility] Pulmonary Critical Care Medicine Sleep Medicine

## 2021-06-13 DIAGNOSIS — Z23 Encounter for immunization: Secondary | ICD-10-CM | POA: Diagnosis not present

## 2021-06-24 ENCOUNTER — Ambulatory Visit
Admission: RE | Admit: 2021-06-24 | Discharge: 2021-06-24 | Disposition: A | Discharge status: Home / Self Care | Payer: Medicare HMO | Source: Ambulatory Visit | Attending: Internal Medicine | Admitting: Internal Medicine

## 2021-06-24 ENCOUNTER — Other Ambulatory Visit: Payer: Self-pay | Admitting: Internal Medicine

## 2021-06-24 ENCOUNTER — Other Ambulatory Visit: Payer: Self-pay

## 2021-06-24 ENCOUNTER — Ambulatory Visit
Admission: RE | Admit: 2021-06-24 | Discharge: 2021-06-24 | Disposition: A | Discharge status: Home / Self Care | Payer: Medicare HMO | Attending: Internal Medicine | Admitting: Internal Medicine

## 2021-06-24 DIAGNOSIS — R059 Cough, unspecified: Secondary | ICD-10-CM

## 2021-07-01 ENCOUNTER — Encounter: Payer: Self-pay | Admitting: Internal Medicine

## 2021-07-01 ENCOUNTER — Ambulatory Visit: Payer: Medicare HMO | Admitting: Internal Medicine

## 2021-07-01 ENCOUNTER — Other Ambulatory Visit: Payer: Self-pay

## 2021-07-01 VITALS — BP 150/80 | HR 68 | Temp 98.5°F | Resp 16 | Ht 70.0 in | Wt 184.8 lb

## 2021-07-01 DIAGNOSIS — J449 Chronic obstructive pulmonary disease, unspecified: Secondary | ICD-10-CM | POA: Diagnosis not present

## 2021-07-01 DIAGNOSIS — R0602 Shortness of breath: Secondary | ICD-10-CM | POA: Diagnosis not present

## 2021-07-01 DIAGNOSIS — R0789 Other chest pain: Secondary | ICD-10-CM

## 2021-07-01 MED ORDER — TRELEGY ELLIPTA 100-62.5-25 MCG/ACT IN AEPB: 1.0000 | INHALATION_SPRAY | Freq: Every day | RESPIRATORY_TRACT | 11 refills | Status: DC

## 2021-07-01 MED ORDER — PREDNISONE 10 MG (21) PO TBPK: ORAL_TABLET | ORAL | 0 refills | Status: DC

## 2021-07-01 MED ORDER — AZITHROMYCIN 250 MG PO TABS: ORAL_TABLET | ORAL | 0 refills | Status: AC

## 2021-07-01 MED ORDER — PREDNISONE 10 MG (48) PO TBPK: ORAL_TABLET | ORAL | 1 refills | Status: DC

## 2021-07-01 NOTE — Progress Notes (Signed)
[facility] [place], [place] [address]  Pulmonary Sleep Medicine   Office Visit Note  Patient Name: Corey Jimenez DOB: 1946-06-08 MRN [id number]  Date of Service: 07/01/2021  Complaints/HPI: He states that he does get frequent bronchitis. Usually he states he takes some cough medicine and it improves. States of late he has had congestion. Notes rattling. He states cough is still there. He was seen here in 2020 and had normal PFT and normal appearing CXR. He does not smoke. He states he quit 40 years ago. He worked in Librarian, academic now retired. He has no significant occupational exposure. Served as Ecologist. Patient has no cardiac issues. He states as far as medical issues nothing significant that he is treated for. He was given breztri and prednisone. Patient has been on cough medicine also  ROS  General: (-) fever, (-) chills, (-) night sweats, (-) weakness Skin: (-) rashes, (-) itching,. Eyes: (-) visual changes, (-) redness, (-) itching. Nose and Sinuses: (-) nasal stuffiness or itchiness, (-) postnasal drip, (-) nosebleeds, (-) sinus trouble. Mouth and Throat: (-) sore throat, (-) hoarseness. Neck: (-) swollen glands, (-) enlarged thyroid, (-) neck pain. Respiratory: + cough, (-) bloody sputum, + shortness of breath, - wheezing. Cardiovascular: - ankle swelling, (-) chest pain. Lymphatic: (-) lymph node enlargement. Neurologic: (-) numbness, (-) tingling. Psychiatric: (-) anxiety, (-) depression   Current Medication: Outpatient Encounter Medications as of 07/01/2021  Medication Sig   albuterol (PROVENTIL HFA;VENTOLIN HFA) 108 (90 Base) MCG/ACT inhaler Inhale 2 puffs into the lungs every 6 (six) hours as needed for wheezing or shortness of breath.   guaiFENesin-codeine 100-10 MG/5ML syrup Take by mouth every 4 (four) hours as needed for cough. Given by Dr Hall Busing   meloxicam (MOBIC) 15 MG tablet meloxicam 15 mg tablet  TAKE (1) TABLET BY MOUTH EVERY  DAY WITH MEALS   mupirocin ointment (BACTROBAN) 2 % mupirocin 2 % topical ointment  APPLY TO AFFECTED AREAS ON NECK   triamcinolone cream (KENALOG) 0.1 % SMARTSIG:1 Application Topical 2-3 Times Daily   No facility-administered encounter medications on file as of 07/01/2021.    Surgical History: Past Surgical History:  Procedure Laterality Date   COLONOSCOPY  2008, 2011,2014   Dr Bary Castilla   HEMORRHOID SURGERY     TONSILLECTOMY      Medical History: Past Medical History:  Diagnosis Date   Asthma 1995   Hearing loss    mild   Irritable bowel disease    Personal history of colonic polyps     Family History: Family History  Problem Relation Age of Onset   Parkinson's disease Father     Social History: Social History   Socioeconomic History   Marital status: Married    Spouse name: Not on file   Number of children: Not on file   Years of education: Not on file   Highest education level: Not on file  Occupational History   Not on file  Tobacco Use   Smoking status: Former    Types: Cigarettes    Quit date: 08/31/1980    Years since quitting: 40.8   Smokeless tobacco: Former  Substance and Sexual Activity   Alcohol use: Yes    Comment: 3-4/day   Drug use: No   Sexual activity: Not on file  Other Topics Concern   Not on file  Social History Narrative   Not on file   Social Determinants of Health   Financial Resource Strain: Not on  file  Food Insecurity: Not on file  Transportation Needs: Not on file  Physical Activity: Not on file  Stress: Not on file  Social Connections: Not on file  Intimate Partner Violence: Not on file    Vital Signs: Blood pressure (!) 150/80, pulse 68, temperature 98.5 F (36.9 C), resp. rate 16, height 5\' 10"  (1.778 m), weight 184 lb 12.8 oz (83.8 kg), SpO2 96 %.  Examination: General Appearance: The patient is well-developed, well-nourished, and in no distress. Skin: Gross inspection of skin unremarkable. Head: normocephalic,  no gross deformities. Eyes: no gross deformities noted. ENT: ears appear grossly normal no exudates. Neck: Supple. No thyromegaly. No LAD. Respiratory: noted few rhonchi. Cardiovascular: Normal S1 and S2 without murmur or rub. Extremities: No cyanosis. pulses are equal. Neurologic: Alert and oriented. No involuntary movements.  LABS: No results found for this or any previous visit (from the past 2160 hour(s)).  Radiology: DG Chest 2 View  Result Date: 06/26/2021 CLINICAL DATA:  75 year old male with history of cough for the past 2 weeks. Wheezing. EXAM: CHEST - 2 VIEW COMPARISON:  Chest x-ray 09/26/2018. FINDINGS: Lung volumes are normal. No consolidative airspace disease. No pleural effusions. No pneumothorax. No pulmonary nodule or mass noted. Pulmonary vasculature and the cardiomediastinal silhouette are within normal limits. Multiple old healed left-sided rib fractures. IMPRESSION: No radiographic evidence of acute cardiopulmonary disease. Electronically Signed   By: Vinnie Langton M.D.   On: 06/26/2021 13:03    No results found.  DG Chest 2 View  Result Date: 06/26/2021 CLINICAL DATA:  75 year old male with history of cough for the past 2 weeks. Wheezing. EXAM: CHEST - 2 VIEW COMPARISON:  Chest x-ray 09/26/2018. FINDINGS: Lung volumes are normal. No consolidative airspace disease. No pleural effusions. No pneumothorax. No pulmonary nodule or mass noted. Pulmonary vasculature and the cardiomediastinal silhouette are within normal limits. Multiple old healed left-sided rib fractures. IMPRESSION: No radiographic evidence of acute cardiopulmonary disease. Electronically Signed   By: Vinnie Langton M.D.   On: 06/26/2021 13:03      Assessment and Plan: Patient Active Problem List   Diagnosis Date Noted   Diverticulosis of colon with hemorrhage 04/19/2013   Perforation of colon as colonoscopy complication ([place]) 09/02/7251   Personal history of colonic polyps 03/09/2013    1.  Obstructive chronic bronchitis without exacerbation ([place]) COPD with mild exacerbation and suggested putting on prednisone azithromycin and also reviewed the patient's inhalers patient will be placed on Trelegy inhaler prescriptions given - DG Chest 2 View; Future - predniSONE (STERAPRED UNI-PAK 21 TAB) 10 MG (21) TBPK tablet; As directed for 12 days  Dispense: 1 each; Refill: 0 - azithromycin (ZITHROMAX) 250 MG tablet; Take 2 tablets on day 1, then 1 tablet daily on days 2 through 5  Dispense: 6 tablet; Refill: 0 - Fluticasone-Umeclidin-Vilant (TRELEGY ELLIPTA) 100-62.5-25 MCG/ACT AEPB; Inhale 1 puff into the lungs daily.  Dispense: 1 each; Refill: 11  2. Shortness of breath As a result of COPD exacerbation we will check spirometry supportive care  3. Other chest pain He states when he coughs he develops chest pain. Patient ordinarily does not get chest pain    General Counseling: I have discussed the findings of the evaluation and examination with Janis.  I have also discussed any further diagnostic evaluation thatmay be needed or ordered today. Marky verbalizes understanding of the findings of todays visit. We also reviewed his medications today and discussed drug interactions and side effects including but not limited excessive drowsiness and  altered mental states. We also discussed that there is always a risk not just to him but also people around him. he has been encouraged to call the office with any questions or concerns that should arise related to todays visit.  No orders of the defined types were placed in this encounter.    Time spent: 71  I have personally obtained a history, examined the patient, evaluated laboratory and imaging results, formulated the assessment and plan and placed orders.    Allyne Gee, MD [facility] Pulmonary and Critical Care Sleep medicine

## 2021-07-08 ENCOUNTER — Telehealth: Payer: Self-pay

## 2021-07-08 ENCOUNTER — Other Ambulatory Visit: Payer: Self-pay

## 2021-07-08 MED ORDER — LEVOFLOXACIN 500 MG PO TABS: 500.0000 mg | ORAL_TABLET | Freq: Every day | ORAL | 0 refills | Status: DC

## 2021-07-08 NOTE — Telephone Encounter (Signed)
Pt called that that he still congested and coughing as per dr Humphrey Rolls send levaquin for 7 days and take mucinex for cough and also pt advised that we send levaquin for 7 days

## 2021-07-08 NOTE — Telephone Encounter (Signed)
Pt called LMOM advising that he wasn't feeling any better and is done with his abx but stillhas some prednisone left.  I called pt back no answer and I LMOM for pt to call office back after 130 pm after lunch hours.

## 2021-08-11 ENCOUNTER — Ambulatory Visit: Payer: Medicare HMO | Admitting: Physician Assistant

## 2021-09-09 ENCOUNTER — Other Ambulatory Visit: Payer: Self-pay | Admitting: Urology

## 2021-09-09 ENCOUNTER — Other Ambulatory Visit (HOSPITAL_COMMUNITY): Payer: Self-pay | Admitting: Urology

## 2021-09-09 DIAGNOSIS — R972 Elevated prostate specific antigen [PSA]: Secondary | ICD-10-CM

## 2021-09-18 ENCOUNTER — Ambulatory Visit
Admission: RE | Admit: 2021-09-18 | Discharge: 2021-09-18 | Disposition: A | Discharge status: Home / Self Care | Payer: Medicare HMO | Source: Ambulatory Visit | Attending: Urology | Admitting: Urology

## 2021-09-18 DIAGNOSIS — R972 Elevated prostate specific antigen [PSA]: Secondary | ICD-10-CM | POA: Insufficient documentation

## 2021-09-18 MED ORDER — GADOBUTROL 1 MMOL/ML IV SOLN
7.5000 mL | Freq: Once | INTRAVENOUS | Status: AC | PRN
  Administered 2021-09-18: 7.5 mL via INTRAVENOUS

## 2021-10-29 ENCOUNTER — Encounter
Admission: RE | Admit: 2021-10-29 | Discharge: 2021-10-29 | Disposition: A | Discharge status: Home / Self Care | Payer: Medicare HMO | Source: Ambulatory Visit | Attending: Urology | Admitting: Urology

## 2021-10-29 ENCOUNTER — Other Ambulatory Visit: Payer: Self-pay

## 2021-10-29 ENCOUNTER — Encounter: Payer: Self-pay | Admitting: Urgent Care

## 2021-10-29 VITALS — Ht 70.0 in | Wt 175.0 lb

## 2021-10-29 DIAGNOSIS — Z01818 Encounter for other preprocedural examination: Secondary | ICD-10-CM | POA: Diagnosis not present

## 2021-10-29 DIAGNOSIS — Z01812 Encounter for preprocedural laboratory examination: Secondary | ICD-10-CM

## 2021-10-29 HISTORY — DX: Unspecified chronic bronchitis: J42

## 2021-10-29 HISTORY — DX: Elevated prostate specific antigen (PSA): R97.20

## 2021-10-29 LAB — CBC
HCT: 44.6 % (ref 39.0–52.0)
Hemoglobin: 14.7 g/dL (ref 13.0–17.0)
MCH: 29.2 pg (ref 26.0–34.0)
MCHC: 33 g/dL (ref 30.0–36.0)
MCV: 88.7 fL (ref 80.0–100.0)
Platelets: 244 10*3/uL (ref 150–400)
RBC: 5.03 MIL/uL (ref 4.22–5.81)
RDW: 13.4 % (ref 11.5–15.5)
WBC: 5.3 10*3/uL (ref 4.0–10.5)
nRBC: 0 % (ref 0.0–0.2)

## 2021-10-29 LAB — BASIC METABOLIC PANEL
Anion gap: 8 (ref 5–15)
BUN: 17 mg/dL (ref 8–23)
CO2: 27 mmol/L (ref 22–32)
Calcium: 9.2 mg/dL (ref 8.9–10.3)
Chloride: 104 mmol/L (ref 98–111)
Creatinine, Ser: 0.86 mg/dL (ref 0.61–1.24)
GFR, Estimated: >60 mL/min (ref >60)
Glucose, Bld: 99 mg/dL (ref 70–99)
Potassium: 4.2 mmol/L (ref 3.5–5.1)
Sodium: 139 mmol/L (ref 135–145)

## 2021-10-29 NOTE — Patient Instructions (Addendum)
Your procedure is scheduled on: Thursday, March 9 ?Report to the Registration Desk on the 1st floor of the [facility]. ?To find out your arrival time, please call [phone] between 1PM - 3PM on: Wednesday, March 8 ? ?REMEMBER: ?Instructions that are not followed completely may result in serious medical risk, up to and including death; or upon the discretion of your surgeon and anesthesiologist your surgery may need to be rescheduled. ? ?Do not eat food after midnight the night before surgery.  ?No gum chewing, lozengers or hard candies. ? ?You may however, drink CLEAR liquids up to 2 hours before you are scheduled to arrive for your surgery. Do not drink anything within 2 hours of your scheduled arrival time. ? ?Clear liquids include: ?- water  ?- apple juice without pulp ?- gatorade (not RED colors) ?- black coffee or tea (Do NOT add milk or creamers to the coffee or tea) ?Do NOT drink anything that is not on this list. ? ?TAKE THESE MEDICATIONS THE MORNING OF SURGERY WITH A SIP OF WATER: ? ?Albuterol inhaler ?Dutasteride (Avodart) ? ?Use inhalers on the day of surgery and bring to the hospital. ? ?One week prior to surgery: starting march 2 ?Stop meloxicam, aspirin and Anti-inflammatories (NSAIDS) such as Advil, Aleve, Ibuprofen, Motrin, Naproxen, Naprosyn and Aspirin based products such as Excedrin, Goodys Powder, BC Powder. ?Stop ANY OVER THE COUNTER supplements until after surgery. Stop red yeast rice, fish oil, multivitamin ?You may however, continue to take Tylenol if needed for pain up until the day of surgery. ? ?No Alcohol for 24 hours before or after surgery. ? ?No Smoking including e-cigarettes for 24 hours prior to surgery.  ?No chewable tobacco products for at least 6 hours prior to surgery.  ?No nicotine patches on the day of surgery. ? ?Do not use any "recreational" drugs for at least a week prior to your surgery.  ?Please be advised that the combination of cocaine and anesthesia may have  negative outcomes, up to and including death. ?If you test positive for cocaine, your surgery will be cancelled. ? ?On the morning of surgery brush your teeth with toothpaste and water, you may rinse your mouth with mouthwash if you wish. ?Do not swallow any toothpaste or mouthwash. ? ?Do not wear jewelry, make-up, hairpins, clips or nail polish. ? ?Do not wear lotions, powders, or perfumes.  ? ?Do not shave body from the neck down 48 hours prior to surgery just in case you cut yourself which could leave a site for infection.  ? ?Contact lenses, hearing aids and dentures may not be worn into surgery. ? ?Do not bring valuables to the hospital. [facility] is not responsible for any missing/lost belongings or valuables.  ? ?Fleets enema or bowel prep as directed prior to surgery. ? ?Notify your doctor if there is any change in your medical condition (cold, fever, infection). ? ?Wear comfortable clothing (specific to your surgery type) to the hospital. ? ?After surgery, you can help prevent lung complications by doing breathing exercises.  ?Take deep breaths and cough every 1-2 hours. Your doctor may order a device called an Incentive Spirometer to help you take deep breaths. ? ?If you are being discharged the day of surgery, you will not be allowed to drive home. ?You will need a responsible adult (18 years or older) to drive you home and stay with you that night.  ? ?If you are taking public transportation, you will need to have a responsible adult (18  years or older) with you. ?Please confirm with your physician that it is acceptable to use public transportation.  ? ?Please call the [place] Dept. at [phone] if you have any questions about these instructions. ? ?Surgery Visitation Policy: ? ?Patients undergoing a surgery or procedure may have one family member or support person with them as long as that person is not COVID-19 positive or experiencing its symptoms.  ?That person may remain in the  waiting area during the procedure and may rotate out with other people. ?

## 2021-10-31 NOTE — H&P (Signed)
NAME: Corey Jimenez, Corey Jimenez. ?MEDICAL RECORD NO: [id number] ?ACCOUNT NO: [id number] ?DATE OF BIRTH: July 13, 1946 ?FACILITY: [facility] ?LOCATION: [facility] ?PHYSICIAN: Otelia Limes. Yves Dill, MD ? ?History and Physical  ? ?DATE OF ADMISSION: 11/06/2021 ? ?Same day surgery 11/06/2021. ? ?CHIEF COMPLAINT:  Elevated PSA and abnormal prostate MRI scan. ? ?HISTORY OF PRESENT ILLNESS:  The patient is a 76 year old white male, who was found to have an elevated PSA of 7.3 ng/mL on 07/29/2021.  He had a prior PSA of 3.8 ng/mL in 2020.  He was further evaluated with exosome study, which revealed a score of  ?71.16 which was above the cutoff for higher risk of high-grade prostate cancer.  He subsequently underwent prostate MRI scan, which revealed a 44 mL prostate with a TI-RADS category 4 lesion involving the right mid gland.  He comes in now for UroNav  ?fusion biopsy of the prostate gland. ? ?____:  NO DRUG ALLERGIES. ? ?CURRENT MEDICATIONS:  Metamucil, fish oil, red yeast rice, multivitamins, aspirin, and meloxicam p.r.n. ? ?PAST SURGICAL HISTORY:  Tonsillectomy in 1951.  Right eyelid surgery 2019.  Hemorrhoidectomy 2012. ? ?PAST AND CURRENT MEDICAL CONDITIONS:  Arthritis of the shoulders. ? ?REVIEW OF SYSTEMS:  History of childhood asthma.  He denied chest pain, shortness of breath, diabetes, stroke or heart disease. ? ?SOCIAL HISTORY:  He quit smoking in 1980, with a 25-pack-year history.  He consumes 20 alcoholic beverages per week. ? ?FAMILY HISTORY:  Father died at age 78 of Parkinson's disease.  Mother died of a car accident at age 60.  There is no family history of prostate cancer. ? ?PHYSICAL EXAMINATION:   ?VITAL SIGNS:  Weight 182 pounds, height 5 feet 10 inches.  ?GENERAL:  Well-nourished white male in no acute distress. ?HEENT:  Sclerae were clear.  Pupils are equally round, reactive to light and accommodation.  Extraocular movements are intact. ?NECK:  No palpable masses or tenderness.  No audible carotid bruits. ?LYMPHATIC:  No  palpable cervical or inguinal adenopathy. ?LUNGS:  Clear to auscultation. ?CARDIOVASCULAR:  Regular rhythm and rate without audible murmurs. ?ABDOMEN:  Soft, nontender abdomen.  No CVA tenderness. ?GENITOURINARY:  Circumcised.  Testes smooth, nontender, approximately 20 mL in size each.  ?RECTAL EXAM:  30 gram smooth, nontender prostate. ?NEUROMUSCULAR:  Alert and oriented x3. ? ?IMPRESSION:   ?1.  Elevated PSA. ?2.  Abnormal prostate gland on MRI scan.  ? ?PLAN:  UroNav fusion biopsy of the prostate. ? ? ?CHR ?D: 10/30/2021 3:43:19 pm T: 10/30/2021 5:24:00 pm  ?JOB: [id number]/ [id number]  ?

## 2021-11-03 DIAGNOSIS — I2510 Atherosclerotic heart disease of native coronary artery without angina pectoris: Secondary | ICD-10-CM

## 2021-11-03 DIAGNOSIS — I214 Non-ST elevation (NSTEMI) myocardial infarction: Secondary | ICD-10-CM

## 2021-11-03 HISTORY — DX: Non-ST elevation (NSTEMI) myocardial infarction: I21.4

## 2021-11-03 HISTORY — PX: CORONARY ANGIOPLASTY WITH STENT PLACEMENT: SHX49

## 2021-11-03 HISTORY — DX: Atherosclerotic heart disease of native coronary artery without angina pectoris: I25.10

## 2021-11-06 ENCOUNTER — Ambulatory Visit: Admission: RE | Admit: 2021-11-06 | Payer: Medicare HMO | Source: Home / Self Care | Admitting: Urology

## 2021-11-06 ENCOUNTER — Encounter: Admission: RE | Payer: Self-pay | Source: Home / Self Care

## 2021-11-06 SURGERY — BIOPSY, PROSTATE
Anesthesia: Choice

## 2022-04-07 ENCOUNTER — Encounter
Admission: RE | Admit: 2022-04-07 | Discharge: 2022-04-07 | Disposition: A | Discharge status: Home / Self Care | Payer: Medicare HMO | Source: Ambulatory Visit | Attending: Urology | Admitting: Urology

## 2022-04-07 ENCOUNTER — Other Ambulatory Visit: Payer: Self-pay

## 2022-04-07 HISTORY — DX: Acute myocardial infarction, unspecified: I21.9

## 2022-04-07 HISTORY — DX: Other complications of anesthesia, initial encounter: T88.59XA

## 2022-04-07 NOTE — Patient Instructions (Signed)
Your procedure is scheduled on: Thurs 8/17 Report to [place]. To find out your arrival time please call [phone] between 1PM - 3PM on Wed 8/16.  Remember: Instructions that are not followed completely may result in serious medical risk, up to and including death, or upon the discretion of your surgeon and anesthesiologist your surgery may need to be rescheduled.     _X__ 1. Do not eat food after midnight the night before your procedure.                 No gum chewing or hard candies. You may drink clear liquids up to 2 hours                 before you are scheduled to arrive for your surgery- DO not drink clear                 liquids within 2 hours of the start of your surgery.                 Clear Liquids include:  water, apple juice without pulp, clear carbohydrate                 drink such as Clearfast or Gatorade, Black Coffee or Tea (Do not add                 anything to coffee or tea). Diabetics water only  __X__2.  On the morning of surgery brush your teeth with toothpaste and water, you                 may rinse your mouth with mouthwash if you wish.  Do not swallow any              toothpaste of mouthwash.     _X__ 3.  No Alcohol for 24 hours before or after surgery.   _X__ 4.  Do Not Smoke or use e-cigarettes For 24 Hours Prior to Your Surgery.                 Do not use any chewable tobacco products for at least 6 hours prior to                 surgery.  ____  5.  Bring all medications with you on the day of surgery if instructed.   __X__  6.  Notify your doctor if there is any change in your medical condition      (cold, fever, infections).     Do not wear jewelry, make-up, hairpins, clips or nail polish. Do not wear lotions, powders, or perfumes.  Do not shave 48 hours prior to surgery. Men may shave face and neck. Do not bring valuables to the hospital.    [facility] is not responsible for any  belongings or valuables.  Contacts, dentures/partials or body piercings may not be worn into surgery. Bring a case for your contacts, glasses or hearing aids, a denture cup will be supplied. Leave your suitcase in the car. After surgery it may be brought to your room. For patients admitted to the hospital, discharge time is determined by your treatment team.   Patients discharged the day of surgery will not be allowed to drive home.    __X__ Take these medicines the morning of surgery with A SIP OF WATER:    1. dutasteride (AVODART) 0.5 MG capsule  2. metoprolol tartrate (LOPRESSOR) 12.5  MG tablet  3.   4.  5.  6.  ____ Fleet Enema (as directed)   ____ Use CHG Soap/SAGE wipes as directed  __X__ Use inhalers on the day of surgery  ____ Stop metformin/Janumet/Farxiga 2 days prior to surgery    ____ Take 1/2 of usual insulin dose the night before surgery. No insulin the morning          of surgery.   __X__ Stop Blood Thinners BRILINTA 5 DAYS PRIOR TO PROCEDURE AS INSTRUCTED BY CARDIOLOGIST    __X__ Stop Anti-inflammatories 7 days before surgery such as Advil, Ibuprofen, Motrin,  BC or Goodies Powder, Naprosyn, Naproxen, Aleve, Aspirin    __X__ Stop all herbals and supplements, fish oil or vitamins for 1 weekd  until after surgery.    ____ Bring C-Pap to the hospital.

## 2022-04-07 NOTE — H&P (Unsigned)
NAME: Corey Jimenez, KNISLEY MEDICAL RECORD NO: [id number] ACCOUNT NO: [id number] DATE OF BIRTH: 12/05/1945 FACILITY: [facility] LOCATION: [facility] PHYSICIAN: Otelia Limes. Yves Dill, MD  History and Physical   DATE OF ADMISSION: 04/07/2022  Same day surgery August 17th.  CHIEF COMPLAINT:  Elevated PSA and abnormal prostate gland MRI scan.  HISTORY OF PRESENT ILLNESS:  The patient is a 76 year old white male who was found to have an elevated PSA of 7.3 ng/mL with a PI-RADS category 4 lesion of the right mid gland.  He had been scheduled for prostate biopsy earlier this year, but developed  non-ST elevated MI, requiring stent placement on March 6th.  He has now been cleared for biopsy by his cardiologist.  The cardiologist recommended stopping the Brilinta 5 days prior to the biopsy.  The patient comes in now for UroNav fusion biopsy of his  prostate gland.  The prostate volume on the MRI scan was 44 mL.  The PI-RADS category 4 lesion was present on the right side.  ____:  No drug allergies.  CURRENT MEDICATIONS:  Brilinta, ezetimibe, dutasteride, albuterol, Trelegy, multivitamins, aspirin, Metamucil, vitamin D3, atorvastatin, metoprolol and triamcinolone.  SURGICAL HISTORY: 1.  Tonsillectomy, 1951. 2.  Right eyelid surgery, 2019. 3.  Hemorrhoidectomy, 2012.  PAST AND CURRENT MEDICAL CONDITIONS:   1.  Coronary artery disease. 2.  Shoulder arthritis. 3.  BPH.  REVIEW OF SYSTEMS:  The patient denies chest pain, diabetes or stroke.  SOCIAL HISTORY:  The patient quit smoking in 1980, with a 25-pack-year history.  He consumes 20 alcoholic beverages per week.  FAMILY HISTORY:  Father died at age 41 of Parkinson's disease.  Mother died of a car accident at age 52.  There is no family history of prostate cancer.  PHYSICAL EXAMINATION:   VITAL SIGNS:  Weight 182 pounds, height 5 feet 10 inches. GENERAL:  Well-nourished white male in no acute distress. HEENT:  Sclerae were clear.  Pupils are equally round,  reactive to light and accommodation.  Extraocular movements intact. NECK:  No palpable masses or tenderness.  No audible carotid bruits. LYMPHATIC:  No palpable cervical or inguinal adenopathy. PULMONARY:  Clear to auscultation. CARDIOVASCULAR:  Regular rhythm and rate. ABDOMEN:  Soft, nontender abdomen.  No CVA tenderness. GENITOURINARY:  Circumcised.  Testes smooth, nontender, approximately 20 mL in size each. RECTAL:  A 30 gram smooth, nontender prostate. NEUROMUSCULAR:  Alert and oriented x3.  IMPRESSION:  1.  Elevated PSA. 2.  PI-RADS category 4 lesion of right mid prostate. 3.  Elevated exosome on IntelliScore of 71.16. 4.  Benign prostatic hyperplasia with lower urinary tract symptoms. 5.  Recent non-ST elevated myocardial infarction with drug-eluting stent placements 11/03/2021.  PLAN:  UroNav fusion biopsy of the prostate.   CHR D: 04/07/2022 4:19:47 pm T: 04/07/2022 5:20:00 pm  JOB: [id number]/ [id number]

## 2022-04-10 ENCOUNTER — Encounter: Payer: Self-pay | Admitting: Urgent Care

## 2022-04-10 NOTE — Progress Notes (Signed)
Perioperative Services  Pre-Admission/Anesthesia Testing Clinical Review  Date: 04/13/22  Patient Demographics:  Name: Corey Jimenez DOB:   [birth year] MRN:   [id number]  Planned Surgical Procedure(s):    Case: [id number] Date/Time: 04/16/22 1039   Procedure: PROSTATE BIOPSY  URO NAV   Anesthesia type: Choice   Pre-op diagnosis: ELEVATED PSA   Location: [place] OR ROOM 10 / [place] [facility]S FOR ANESTHESIA GROUP   Surgeons: Royston Cowper, MD   NOTE: Available PAT nursing documentation and vital signs have been reviewed. Clinical nursing staff has updated patient's PMH/PSHx, current medication list, and drug allergies/intolerances to ensure comprehensive history available to assist in medical decision making as it pertains to the aforementioned surgical procedure and anticipated anesthetic course. Extensive review of available clinical information performed. [place] [facility] and PSHx updated with any diagnoses/procedures that  may have been inadvertently omitted during his intake with the pre-admission testing department's nursing staff.  Clinical Discussion:  Corey Jimenez is a 76 y.o. male who is submitted for pre-surgical anesthesia review and clearance prior to him undergoing the above procedure. Patient is a Former Smoker (quit 08/1980). Pertinent PMH includes: CAD, NSTEMI, HTN, HLD, asthma, chronic bronchitis, OA, elevated PSA (with prostate lesion), BPH.  Patient is followed by cardiology Delman Cheadle, MD). He was last seen in the cardiology clinic on 02/11/2022; notes reviewed.  At the time of his clinic visit, patient doing well overall from a cardiovascular perspective.  He denied any episodes of chest pain, shortness breath, PND, orthopnea, palpitations, significant peripheral edema, vertiginous symptoms, or presyncope/syncope.  Patient with past medical history significant for cardiovascular diagnoses.  Patient suffered an NSTEMI on 11/03/2021.  He was treated at [facility].    TTE  performed on 11/03/2021 revealed a normal left ventricular systolic function with an EF of 55 to 60%.  There was mild right ventricular enlargement.  Trivial tricuspid valve regurgitation noted.  There was no evidence of a significant transvalvular gradient to suggest stenosis.  Diagnostic left heart catheterization was performed on 11/03/2021 revealing 90-95% stenosis of the LCx prior to the bifurcation of the AV groove circumflex and a large posterior lateral branch.  There were minor luminal irregularities (not >20-30%, throughout all RCA distributions. PCI was performed placing a 3.5 x 22 mm Medtronic Frontier DES x1 yielding excellent angiographic result and TIMI-3 flow.    Patient remains on daily DAPT therapy (ASA + ticagrelor).  He is reportedly compliant with therapy with no evidence or reports of GI bleeding blood pressure elevated at 150/70 on current prescribed beta-blocker (metoprolol tartrate) monotherapy.  Patient is taking atorvastatin for his HLD and further ASCVD prevention.  He is not diabetic.  Patient does not have an OSAH diagnosis.  Patient reported to maintain an active lifestyle.  Patient walks an hour every day and participates in other activities and exercises throughout the week.  These activities do not elicit any angina/anginal equivalent symptoms, therefore per the DASI, patient able to achieve >/= 4 METS of activity.  No changes were made to his medication regimen.  Patient to follow-up with outpatient cardiology in 3 months or sooner if needed.  Corey Jimenez recently found to have an elevated PSA of 7.3.  Prostate MRI performed on 09/18/2021 revealed a prostatic volume 44 cm.  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  Patient has been seen and consult by urology and the decision has been made to pursue further evaluation via biopsy.  Patient has been scheduled for a NAVIGATIONAL PROSTATE BIOPSY (URO NAV) on 04/16/2022 with  Dr. Maryan Puls, MD. Given patient's past medical history significant for cardiovascular diagnoses, presurgical cardiac clearance was sought by the performing surgeon's office and PAT team.  Per cardiology, "patient may need a urological procedure.  If so, I think Mr. Battiste can safely come off of his Brilinta, and possibly aspirin, if the urologist request for several days prior to that procedure".  Again, this patient is on daily DAPT therapy.  Patient has been instructed on recommendations from his cardiologist and urologist for holding his ticagrelor for 5 days prior to his procedure with plans to restart since postoperatively respectively minimized by primary attending surgeon.  Patient is aware that his last dose of ticagrelor should be on 04/10/2022.  Patient will continue his daily low-dose ASA throughout his perioperative course.  Patient reports previous perioperative complications with anesthesia in the past.  Patient reporting that he required suctioning during routine colonoscopy, and suctioning caused "bruising to his uvula".  Patient denies any previous complications from any medications used for anesthesia/sedation. In review of the EMR, there are no records available for review regarding patient's past surgical/anesthetic courses within the [facility] system.     04/07/2022    9:43 AM 10/29/2021    8:47 AM 07/01/2021    3:29 PM  Vitals with BMI  Height 5' 9.5" '5\' 10"'$  '5\' 10"'$   Weight 161 lbs 175 lbs 184 lbs 13 oz  BMI 23.44 15.40 08.67  Systolic   619  Diastolic   80  Pulse   68    Providers/Specialists:   NOTE: Primary physician provider listed below. Patient may have been seen by APP or partner within same practice.   PROVIDER ROLE / SPECIALTY LAST Towana Badger, MD Urology (Surgeon) 04/07/2022  Albina Billet, MD Primary Care Provider ???  Wallace Cullens, MD  Cardiology 02/11/2022   Allergies:  Patient has no known allergies.  Current Home Medications:   No current  facility-administered medications for this encounter.    albuterol (PROVENTIL HFA;VENTOLIN HFA) 108 (90 Base) MCG/ACT inhaler   aspirin EC 81 MG tablet   atorvastatin (LIPITOR) 40 MG tablet   dutasteride (AVODART) 0.5 MG capsule   guaiFENesin-codeine 100-10 MG/5ML syrup   meloxicam (MOBIC) 15 MG tablet   metoprolol tartrate (LOPRESSOR) 25 MG tablet   Multiple Vitamin (MULTIVITAMIN WITH MINERALS) TABS tablet   mupirocin ointment (BACTROBAN) 2 %   Psyllium (METAMUCIL PO)   ticagrelor (BRILINTA) 90 MG TABS tablet   Vitamin D, Ergocalciferol, (DRISDOL) 1.25 MG (50000 UNIT) CAPS capsule   History:   Past Medical History:  Diagnosis Date   Asthma 08/31/1993   BPH (benign prostatic hyperplasia)    CAD (coronary artery disease) 11/03/2021   a.) NSTEMI 11/03/2021 --> LHC --> 30-40% mLAD, 90-95% LCx prior to the bifurcation of the AV groove circumflex and a large posterior lateral branch, minor luminal irregs (not > 20-30%) throughout all RCA distributions; PCI performed placing a 3.5 x 22 mm Medtronic Frontier DES x 1 to LCx   Chronic bronchitis (HCC)    Complication of anesthesia    had event during colonoscopy needed suction which caused bruising to uvula   Elevated PSA    a.) PSA elevated at 7.3; b.) Prostate MRI 09/18/2021 --> volume 44 cm, posterior RIGHT mind gland lesion 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   Hemorrhoids    a.) s/p hemorrhoidectomy 2012   HLD (  hyperlipidemia)    HTN (hypertension)    Irritable bowel disease    Long term current use of antithrombotics/antiplatelets    a.) on daily DAPT therapy (ASA + ticagrelor)   Mild hearing loss    NSTEMI (non-ST elevated myocardial infarction) ([place]) 11/03/2021   a.) TTE 11/03/2021: EF 55-60%, mild RVE, triv TR; b.) LHC -->  90-95% LCx prior to the bifurcation of the AV groove circumflex and a large posterior lateral branch --> PCI performed placing a 3.5 x 22 mm Medtronic Frontier DES x 1   Osteoarthritis     Perforation of sigmoid colon ([place]) 2014   Personal history of colonic polyps    Past Surgical History:  Procedure Laterality Date   BLEPHAROPLASTY     COLONOSCOPY  2008, 2011,2014   Dr Bary Castilla   CORONARY ANGIOPLASTY WITH STENT PLACEMENT Left 11/03/2021   Procedure: CORONARY ANGIOPLASTY WITH STENT PLACEMENT; Location: [place]; Surgeon: Juanda Chance, MD   HEMORRHOID SURGERY N/A 2012   TONSILLECTOMY Bilateral 1951   Family History  Problem Relation Age of Onset   Parkinson's disease Father    Social History   Tobacco Use   Smoking status: Former    Types: Cigarettes    Quit date: 08/31/1980    Years since quitting: 41.6   Smokeless tobacco: Former    Types: Nurse, children's Use: Never used  Substance Use Topics   Alcohol use: Yes    Comment: 3-4 liquor day   Drug use: No    Pertinent Clinical Results:  LABS: Labs reviewed: Acceptable for surgery.  No visits with results within 3 Day(s) from this visit.  Latest known visit with results is:  Hospital Outpatient Visit on 10/29/2021  Component Date Value Ref Range Status   Sodium 10/29/2021 139  135 - 145 mmol/L Final   Potassium 10/29/2021 4.2  3.5 - 5.1 mmol/L Final   Chloride 10/29/2021 104  98 - 111 mmol/L Final   CO2 10/29/2021 27  22 - 32 mmol/L Final   Glucose, Bld 10/29/2021 99  70 - 99 mg/dL Final   Glucose reference range applies only to samples taken after fasting for at least 8 hours.   BUN 10/29/2021 17  8 - 23 mg/dL Final   Creatinine, Ser 10/29/2021 0.86  0.61 - 1.24 mg/dL Final   Calcium 10/29/2021 9.2  8.9 - 10.3 mg/dL Final   GFR, Estimated 10/29/2021 >60  >60 mL/min Final   Comment: (NOTE) Calculated using the CKD-EPI Creatinine Equation (2021)    Anion gap 10/29/2021 8  5 - 15 Final   Performed at [facility], [address] [place], [place] [address]   WBC 10/29/2021 5.3  4.0 - 10.5 K/uL Final   RBC 10/29/2021 5.03  4.22 - 5.81 MIL/uL Final   Hemoglobin  10/29/2021 14.7  13.0 - 17.0 g/dL Final   HCT 10/29/2021 44.6  39.0 - 52.0 % Final   MCV 10/29/2021 88.7  80.0 - 100.0 fL Final   MCH 10/29/2021 29.2  26.0 - 34.0 pg Final   MCHC 10/29/2021 33.0  30.0 - 36.0 g/dL Final   RDW 10/29/2021 13.4  11.5 - 15.5 % Final   Platelets 10/29/2021 244  150 - 400 K/uL Final   nRBC 10/29/2021 0.0  0.0 - 0.2 % Final   Performed at [facility], [address] [place], [place] [address]    ECG: Date: 10/29/2021 Time ECG obtained: 1059 AM Rate: 69 bpm Rhythm: normal  sinus Axis (leads I and aVF): Normal Intervals: PR 148 ms. QRS 94 ms. QTc 407 ms. ST segment and T wave changes: No evidence of acute ST segment elevation or depression.  Evidence of an age undetermined inferior infarct present. Comparison: No previous tracings available for review and comparison.    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 PI-RADS: 4. (Dynacad 3D post processing performed) ROI #1. Enlarged nodular transitional zone most consistent with benign prostate hypertrophy. PI-RADS: 2  LEFT HEART CATHETERIZATION AND CORONARY ANGIOGRAPHY performed on 11/03/2021 Normal left ventricular systolic function by echocardiography Multivessel CAD 30-40% mid LAD 90-95% LCx prior to the bifurcation of the AV groove circumflex and a large posterior lateral branch Minor luminal irregularities, not > 20-30%, throughout all RCA distributions Successful PCI 3.5 x 22 mm Medtronic Frontier DES x1 to the LCx yielding excellent angiographic result and TIMI-3 flow  TRANSTHORACIC ECHOCARDIOGRAM performed on 11/03/2021 Normal left ventricular systolic function with an EF of 55 to 60% Mild right ventricular enlargement Trivial tricuspid valve regurgitation Normal gradients; no valvular stenosis  No pericardial effusion  Impression  and Plan:  Corey Jimenez has been referred for pre-anesthesia review and clearance prior to him undergoing the planned anesthetic and procedural courses. Available labs, pertinent testing, and imaging results were personally reviewed by me. This patient has been appropriately cleared by cardiology with an overall ACCEPTABLE risk of significant perioperative cardiovascular complications.  Based on clinical review performed today (04/13/22), barring any significant acute changes in the patient's overall condition, it is anticipated that he will be able to proceed with the planned surgical intervention. Any acute changes in clinical condition may necessitate his procedure being postponed and/or cancelled. Patient will meet with anesthesia team (MD and/or CRNA) on the day of his procedure for preoperative evaluation/assessment. Questions regarding anesthetic course will be fielded at that time.   Pre-surgical instructions were reviewed with the patient during his PAT appointment and questions were fielded by PAT clinical staff. Patient was advised that if any questions or concerns arise prior to his procedure then he should return a call to PAT and/or his surgeon's office to discuss.  Honor Loh, MSN, APRN, FNP-C, CEN [facility]  Peri-operative Services Nurse Practitioner Phone: [phone] Fax: [phone] 04/13/22 1:23 PM  NOTE: This note has been prepared using Dragon dictation software. Despite my best ability to proofread, there is always the potential that unintentional transcriptional errors may still occur from this process.

## 2022-04-15 MED ORDER — FAMOTIDINE 20 MG PO TABS: 20.0000 mg | ORAL_TABLET | Freq: Once | ORAL | Status: AC

## 2022-04-15 MED ORDER — LACTATED RINGERS IV SOLN
INTRAVENOUS | Status: DC
Start: 2022-04-15 — End: 2022-04-16

## 2022-04-15 MED ORDER — DEXTROSE 5 % IV SOLN
80.0000 mg | Freq: Once | INTRAVENOUS | Status: DC
Start: 2022-04-15 — End: 2022-04-16
  Filled 2022-04-15: qty 2

## 2022-04-15 MED ORDER — CHLORHEXIDINE GLUCONATE 0.12 % MT SOLN: 15.0000 mL | Freq: Once | OROMUCOSAL | Status: AC

## 2022-04-15 MED ORDER — ORAL CARE MOUTH RINSE: 15.0000 mL | Freq: Once | OROMUCOSAL | Status: AC

## 2022-04-15 MED ORDER — CEFAZOLIN SODIUM-DEXTROSE 1-4 GM/50ML-% IV SOLN
1.0000 g | Freq: Once | INTRAVENOUS | Status: AC
Start: 2022-04-15 — End: 2022-04-16
  Administered 2022-04-16: 1 g via INTRAVENOUS

## 2022-04-16 ENCOUNTER — Encounter
Admission: RE | Disposition: A | Discharge status: Home / Self Care | Payer: Self-pay | Source: Home / Self Care | Attending: Urology

## 2022-04-16 ENCOUNTER — Ambulatory Visit: Payer: Medicare HMO | Admitting: Urgent Care

## 2022-04-16 ENCOUNTER — Ambulatory Visit
Admission: RE | Admit: 2022-04-16 | Discharge: 2022-04-16 | Disposition: A | Discharge status: Home / Self Care | Payer: Medicare HMO | Attending: Urology | Admitting: Urology

## 2022-04-16 ENCOUNTER — Other Ambulatory Visit: Payer: Self-pay

## 2022-04-16 ENCOUNTER — Encounter: Payer: Self-pay | Admitting: Urology

## 2022-04-16 DIAGNOSIS — I10 Essential (primary) hypertension: Secondary | ICD-10-CM | POA: Insufficient documentation

## 2022-04-16 DIAGNOSIS — Z955 Presence of coronary angioplasty implant and graft: Secondary | ICD-10-CM | POA: Diagnosis not present

## 2022-04-16 DIAGNOSIS — I2510 Atherosclerotic heart disease of native coronary artery without angina pectoris: Secondary | ICD-10-CM | POA: Diagnosis not present

## 2022-04-16 DIAGNOSIS — I252 Old myocardial infarction: Secondary | ICD-10-CM | POA: Insufficient documentation

## 2022-04-16 DIAGNOSIS — Z87891 Personal history of nicotine dependence: Secondary | ICD-10-CM | POA: Insufficient documentation

## 2022-04-16 DIAGNOSIS — E785 Hyperlipidemia, unspecified: Secondary | ICD-10-CM | POA: Diagnosis not present

## 2022-04-16 DIAGNOSIS — Z79899 Other long term (current) drug therapy: Secondary | ICD-10-CM | POA: Insufficient documentation

## 2022-04-16 DIAGNOSIS — C61 Malignant neoplasm of prostate: Secondary | ICD-10-CM | POA: Diagnosis not present

## 2022-04-16 DIAGNOSIS — J45909 Unspecified asthma, uncomplicated: Secondary | ICD-10-CM | POA: Insufficient documentation

## 2022-04-16 DIAGNOSIS — R972 Elevated prostate specific antigen [PSA]: Secondary | ICD-10-CM | POA: Diagnosis present

## 2022-04-16 HISTORY — DX: Unspecified hearing loss, unspecified ear: H91.90

## 2022-04-16 HISTORY — DX: Benign prostatic hyperplasia without lower urinary tract symptoms: N40.0

## 2022-04-16 HISTORY — DX: Hyperlipidemia, unspecified: E78.5

## 2022-04-16 HISTORY — DX: Unspecified osteoarthritis, unspecified site: M19.90

## 2022-04-16 HISTORY — DX: Long term (current) use of antithrombotics/antiplatelets: Z79.02

## 2022-04-16 HISTORY — PX: PROSTATE BIOPSY: SHX241

## 2022-04-16 HISTORY — DX: Unspecified hemorrhoids: K64.9

## 2022-04-16 HISTORY — DX: Essential (primary) hypertension: I10

## 2022-04-16 SURGERY — BIOPSY, PROSTATE
Anesthesia: General | Site: Prostate

## 2022-04-16 MED ORDER — FENTANYL CITRATE (PF) 100 MCG/2ML IJ SOLN
INTRAMUSCULAR | Status: AC
  Filled 2022-04-16: qty 2

## 2022-04-16 MED ORDER — OXYCODONE HCL 5 MG/5ML PO SOLN: 5.0000 mg | Freq: Once | ORAL | Status: DC | PRN

## 2022-04-16 MED ORDER — FAMOTIDINE 20 MG PO TABS
ORAL_TABLET | ORAL | Status: AC
  Administered 2022-04-16: 20 mg via ORAL
  Filled 2022-04-16: qty 1

## 2022-04-16 MED ORDER — FENTANYL CITRATE (PF) 100 MCG/2ML IJ SOLN: 25.0000 ug | INTRAMUSCULAR | Status: DC | PRN

## 2022-04-16 MED ORDER — ACETAMINOPHEN 500 MG PO TABS
1000.0000 mg | ORAL_TABLET | Freq: Once | ORAL | Status: AC
  Administered 2022-04-16: 1000 mg via ORAL

## 2022-04-16 MED ORDER — CEFAZOLIN SODIUM-DEXTROSE 1-4 GM/50ML-% IV SOLN
INTRAVENOUS | Status: AC
  Filled 2022-04-16: qty 50

## 2022-04-16 MED ORDER — LIDOCAINE HCL (CARDIAC) PF 100 MG/5ML IV SOSY
PREFILLED_SYRINGE | INTRAVENOUS | Status: DC | PRN
  Administered 2022-04-16: 100 mg via INTRAVENOUS

## 2022-04-16 MED ORDER — DROPERIDOL 2.5 MG/ML IJ SOLN: 0.6250 mg | Freq: Once | INTRAMUSCULAR | Status: DC | PRN

## 2022-04-16 MED ORDER — FENTANYL CITRATE (PF) 100 MCG/2ML IJ SOLN
INTRAMUSCULAR | Status: DC | PRN
  Administered 2022-04-16: 50 ug via INTRAVENOUS
  Administered 2022-04-16: 25 ug via INTRAVENOUS

## 2022-04-16 MED ORDER — PROPOFOL 500 MG/50ML IV EMUL
INTRAVENOUS | Status: DC | PRN
  Administered 2022-04-16: 165 ug/kg/min via INTRAVENOUS

## 2022-04-16 MED ORDER — ACETAMINOPHEN 10 MG/ML IV SOLN: 1000.0000 mg | Freq: Once | INTRAVENOUS | Status: DC | PRN

## 2022-04-16 MED ORDER — PHENYLEPHRINE 80 MCG/ML (10ML) SYRINGE FOR IV PUSH (FOR BLOOD PRESSURE SUPPORT)
PREFILLED_SYRINGE | INTRAVENOUS | Status: DC | PRN
  Administered 2022-04-16 (×2): 80 ug via INTRAVENOUS
  Administered 2022-04-16: 160 ug via INTRAVENOUS

## 2022-04-16 MED ORDER — OXYCODONE HCL 5 MG PO TABS: 5.0000 mg | ORAL_TABLET | Freq: Once | ORAL | Status: DC | PRN

## 2022-04-16 MED ORDER — GENTAMICIN IN SALINE 1.6-0.9 MG/ML-% IV SOLN
80.0000 mg | INTRAVENOUS | Status: AC
  Administered 2022-04-16: 80 mg via INTRAVENOUS
  Filled 2022-04-16: qty 50

## 2022-04-16 MED ORDER — LEVOFLOXACIN 500 MG PO TABS: 500.0000 mg | ORAL_TABLET | Freq: Every day | ORAL | 0 refills | Status: DC

## 2022-04-16 MED ORDER — GLYCOPYRROLATE 0.2 MG/ML IJ SOLN
INTRAMUSCULAR | Status: DC | PRN
  Administered 2022-04-16: .2 mg via INTRAVENOUS

## 2022-04-16 MED ORDER — ONDANSETRON HCL 4 MG/2ML IJ SOLN
INTRAMUSCULAR | Status: DC | PRN
  Administered 2022-04-16: 4 mg via INTRAVENOUS

## 2022-04-16 MED ORDER — CHLORHEXIDINE GLUCONATE 0.12 % MT SOLN
OROMUCOSAL | Status: AC
  Administered 2022-04-16: 15 mL via OROMUCOSAL
  Filled 2022-04-16: qty 15

## 2022-04-16 MED ORDER — DEXMEDETOMIDINE (PRECEDEX) IN NS 20 MCG/5ML (4 MCG/ML) IV SYRINGE
PREFILLED_SYRINGE | INTRAVENOUS | Status: DC | PRN
  Administered 2022-04-16 (×2): 4 ug via INTRAVENOUS

## 2022-04-16 MED ORDER — PROPOFOL 10 MG/ML IV BOLUS
INTRAVENOUS | Status: DC | PRN
  Administered 2022-04-16: 30 mg via INTRAVENOUS
  Administered 2022-04-16: 20 mg via INTRAVENOUS

## 2022-04-16 MED ORDER — ACETAMINOPHEN 500 MG PO TABS
ORAL_TABLET | ORAL | Status: AC
  Filled 2022-04-16: qty 2

## 2022-04-16 SURGICAL SUPPLY — 15 items
COVER MAYO STAND REUSABLE (DRAPES) ×2 IMPLANT
COVER TRANSDUCER UNLTRASOUND (MISCELLANEOUS) IMPLANT
CUP MEDICINE 2OZ PLAST GRAD ST (MISCELLANEOUS) ×2 IMPLANT
GLOVE BIOGEL M STRL SZ7.5 (GLOVE) ×2 IMPLANT
GUIDE NDL URONAV ULTRASND S (MISCELLANEOUS) IMPLANT
GUIDE NEEDLE URONAV ULTRASND S (MISCELLANEOUS) ×1 IMPLANT
INST BIOPSY MAXCORE 18GX25 (NEEDLE) ×2 IMPLANT
PROBE URONAV BK 8808E 8818 HLD (MISCELLANEOUS) IMPLANT
STRAP SAFETY 5IN WIDE (MISCELLANEOUS) ×2 IMPLANT
SURGILUBE 2OZ TUBE FLIPTOP (MISCELLANEOUS) ×2 IMPLANT
TOWEL OR 17X26 4PK STRL BLUE (TOWEL DISPOSABLE) ×2 IMPLANT
URONAV BK 8808E 8818 PROBE HLD (MISCELLANEOUS) ×1
URONAV MRI FUSION TWO PATIENTS (MISCELLANEOUS) IMPLANT
URONAV ULTRASOUND (MISCELLANEOUS) IMPLANT
URONAV ULTRASOUND NDL GUIDE S (MISCELLANEOUS) ×1

## 2022-04-16 NOTE — Op Note (Signed)
Preoperative diagnosis: 1.  Elevated PSA (R97.2)                                           2.  PI-RADS category 4 lesion on MRI of the prostate (D40.0)   Postoperative diagnosis: Same  Procedure: UroNav fusion biopsy of the prostate gland (CPT 10272,53664)  Surgeon: Otelia Limes. Yves Dill MD  Anesthesia: General  Indications:See the history and physical also. 76 year old ([place]: Apr 17, 1946) white male who was found to have an elevated PSA of 7.3 ng/mL with a PI-RADS category 4 lesion of the right mid gland.  He had been scheduled for prostate biopsy earlier this year, but developed non-ST elevated MI, requiring stent placement on March 6th.  He has now been cleared for biopsy by his cardiologist.  The cardiologist recommended stopping the Brilinta 5 days prior to the biopsy.  The patient comes in now for UroNav fusion biopsy of hisprostate gland.  The prostate volume on the MRI scan was 44 mL.  The PI-RADS category 4 lesion was present on the right side. After informed consent the above procedure(s) were requested     Technique and findings:  After adequate general anesthesia been obtained the patient was placed into left lateral decubitus position and DRE was performed.  The rectal vault was clear.  This point the ultrasound probe was placed and ultrasound images acquired.  The ultrasound images were then fused with the MRI images  PIRAD category 4 lesion on the right side was identified and 4 core biopsies taken.  At this point standard 12 core systematic biopsies were performed.  The ultrasound probe was then removed.  Blood loss was minimal.  The procedure was then terminated and patient transferred to the recovery room in stable condition.

## 2022-04-16 NOTE — Discharge Instructions (Addendum)
Transrectal Ultrasound-Guided Prostate Biopsy, Care After What can I expect after the procedure? After the procedure, it is common to have: Pain and discomfort near your butt (rectum), especially while sitting. Pink-colored pee (urine). This is due to small amounts of blood in your pee. A burning feeling while peeing. Blood in your poop (stool). Bleeding from your butt. Blood in your semen. Follow these instructions at home: Medicines Take over-the-counter and prescription medicines only as told by your doctor. If you were given a sedative during your procedure, do not drive or use machines until your doctor says that it is safe. A sedative is a medicine that helps you relax. If you were prescribed an antibiotic medicine, take it as told by your doctor. Do not stop taking it even if you start to feel better. Activity A sign showing that a person should not lift anything heavy.   Return to your normal activities when your doctor says that it is safe. Ask your doctor when it is okay for you to have sex. You may have to avoid lifting. Ask your doctor how much you can safely lift. General instructions A comparison of three sample cups showing dark yellow, yellow, and pale yellow urine.   Drink enough water to keep your pee pale yellow. Watch your pee, poop, and semen for new bleeding or bleeding that gets worse. Keep all follow-up visits. Contact a doctor if: You have any of these: Blood clots in your pee or poop. Blood in your pee more than 2 weeks after the procedure. Blood in your semen more than 2 months after the procedure. New or worse bleeding in your pee, poop, or semen. Very bad belly pain. Your pee smells bad or unusual. You have trouble peeing. Your lower belly feels firm. You have problems getting an erection. You feel like you may vomit (are nauseous), or you vomit. Get help right away if: You have a fever or chills. You have bright red pee. You have very bad pain  that does not get better with medicine. You cannot pee. Summary After this procedure, it is common to have pain and discomfort near your butt, especially while sitting. You may have blood in your pee and poop. It is common to have blood in your semen. Get help right away if you have a fever or chills. This information is not intended to replace advice given to you by your health care provider. Make sure you discuss any questions you have with your health care provider. Document Revised: 02/10/2021 Document Reviewed: 02/10/2021 Elsevier Patient Education  [place]   The drugs that you were given will stay in your system until tomorrow so for the next 24 hours you should not:  Drive an automobile Make any legal decisions Drink any alcoholic beverage   You may resume regular meals tomorrow.  Today it is better to start with liquids and gradually work up to solid foods.  You may eat anything you prefer, but it is better to start with liquids, then soup and crackers, and gradually work up to solid foods.   Please notify your doctor immediately if you have any unusual bleeding, trouble breathing, redness and pain at the surgery site, drainage, fever, or pain not relieved by medication.    Additional Instructions:        Please contact your physician with any problems or Same Day Surgery at [phone], Monday through Friday 6 am to 4 pm, or  [place] at [facility] number at [phone].

## 2022-04-16 NOTE — Transfer of Care (Signed)
Immediate Anesthesia Transfer of Care Note  Patient: Corey Jimenez  Procedure(s) Performed: PROSTATE BIOPSY  URO NAV (Prostate)  Patient Location: PACU  Anesthesia Type:General  Level of Consciousness: awake  Airway & Oxygen Therapy: Patient Spontanous Breathing and Patient connected to face mask oxygen  Post-op Assessment: Report given to RN and Post -op Vital signs reviewed and stable  Post vital signs: Reviewed and stable  Last Vitals:  Vitals Value Taken Time  BP 85/49 04/16/22 1115  Temp    Pulse 55 04/16/22 1115  Resp 17 04/16/22 1115  SpO2 97 % 04/16/22 1115  Vitals shown include unvalidated device data.  Last Pain:  Vitals:   04/16/22 0927  TempSrc: Oral  PainSc: 0-No pain         Complications: No notable events documented.

## 2022-04-16 NOTE — Anesthesia Postprocedure Evaluation (Signed)
Anesthesia Post Note  Patient: Corey Jimenez  Procedure(s) Performed: PROSTATE BIOPSY  URO NAV (Prostate)  Patient location during evaluation: PACU Anesthesia Type: General Level of consciousness: awake and alert Pain management: pain level controlled Vital Signs Assessment: post-procedure vital signs reviewed and stable Respiratory status: spontaneous breathing, nonlabored ventilation and respiratory function stable Cardiovascular status: blood pressure returned to baseline and stable Postop Assessment: no apparent nausea or vomiting Anesthetic complications: no   No notable events documented.   Last Vitals:  Vitals:   04/16/22 1150 04/16/22 1157  BP: 114/73 125/67  Pulse: (!) 48 (!) 47  Resp: 12 18  Temp:  (!) 36.3 C  SpO2: 98% 100%    Last Pain:  Vitals:   04/16/22 1157  TempSrc: Temporal  PainSc: 0-No pain                 [place] Ouch

## 2022-04-16 NOTE — H&P (Signed)
Date of Initial H&P: 04/07/22  History reviewed, patient examined, no change in status, stable for surgery.

## 2022-04-16 NOTE — Anesthesia Preprocedure Evaluation (Addendum)
Anesthesia Evaluation  Patient identified by MRN, date of birth, ID band Patient awake    Reviewed: Allergy & Precautions, NPO status , Patient's Chart, lab work & pertinent test results  History of Anesthesia Complications (+) history of anesthetic complications (had event during colonoscopy needed suction which caused bruising to uvula)  Airway Mallampati: III  TM Distance: >3 FB Neck ROM: full    Dental no notable dental hx. (+) Chipped   Pulmonary asthma , former smoker,    Pulmonary exam normal        Cardiovascular hypertension, Pt. on medications + CAD, + Past MI (NSTEMI 10/2021) and + Cardiac Stents  Normal cardiovascular exam  TTE 11/03/2021: EF 55-60%, mild RVE, triv TR; b.) LHC -->  90-95% LCx prior to the bifurcation of the AV groove circumflex and a large posterior lateral branch --> PCI performed placing a 3.5 x 22 mm Medtronic Frontier DES x 1   Neuro/Psych negative neurological ROS  negative psych ROS   GI/Hepatic negative GI ROS, Neg liver ROS,   Endo/Other  negative endocrine ROS  Renal/GU negative Renal ROS  negative genitourinary   Musculoskeletal   Abdominal Normal abdominal exam  (+)   Peds  Hematology negative hematology ROS (+)   Anesthesia Other Findings The cardiologist recommended stopping the Brilinta 5 days prior to the biopsy.  Past Medical History: 08/31/1993: Asthma No date: BPH (benign prostatic hyperplasia) 11/03/2021: CAD (coronary artery disease)     Comment:  a.) NSTEMI 11/03/2021 --> LHC --> 30-40% mLAD, 90-95%               LCx prior to the bifurcation of the AV groove circumflex               and a large posterior lateral branch, minor luminal               irregs (not > 20-30%) throughout all RCA distributions;               PCI performed placing a 3.5 x 22 mm Medtronic Frontier               DES x 1 to LCx No date: Chronic bronchitis (HCC) No date: Complication of  anesthesia     Comment:  had event during colonoscopy needed suction which caused              bruising to uvula No date: Elevated PSA     Comment:  a.) PSA elevated at 7.3; b.) Prostate MRI 09/18/2021 -->              volume 44 cm, posterior RIGHT mind gland lesion               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 No date: Hemorrhoids     Comment:  a.) s/p hemorrhoidectomy 2012 No date: HLD (hyperlipidemia) No date: HTN (hypertension) No date: Irritable bowel disease No date: Long term current use of antithrombotics/antiplatelets     Comment:  a.) on daily DAPT therapy (ASA + ticagrelor) No date: Mild hearing loss 11/03/2021: NSTEMI (non-ST elevated myocardial infarction) ([place])     Comment:  a.) TTE 11/03/2021: EF 55-60%, mild RVE, triv TR; b.)               LHC -->  90-95% LCx prior to the bifurcation of the AV               groove circumflex and a large posterior lateral  branch               --> PCI performed placing a 3.5 x 22 mm Medtronic               Frontier DES x 1 No date: Osteoarthritis 2014: Perforation of sigmoid colon ([place]) No date: Personal history of colonic polyps  Past Surgical History: No date: BLEPHAROPLASTY 2008, 2011,2014: COLONOSCOPY     Comment:  Dr Bary Castilla 11/03/2021: CORONARY ANGIOPLASTY WITH STENT PLACEMENT; Left     Comment:  Procedure: CORONARY ANGIOPLASTY WITH STENT PLACEMENT;               Location: [place]; Surgeon: Juanda Chance,               MD 2012: [place]; N/A 1951: TONSILLECTOMY; Bilateral     Reproductive/Obstetrics negative OB ROS                            Anesthesia Physical Anesthesia Plan  ASA: 3  Anesthesia Plan: General   Post-op Pain Management: Tylenol PO (pre-op)*   Induction: Intravenous  PONV Risk Score and Plan: Propofol infusion and TIVA  Airway Management Planned: Natural Airway  Additional Equipment:   Intra-op Plan:   Post-operative  Plan:   Informed Consent: I have reviewed the patients History and Physical, chart, labs and discussed the procedure including the risks, benefits and alternatives for the proposed anesthesia with the patient or authorized representative who has indicated his/her understanding and acceptance.     Dental Advisory Given  Plan Discussed with: Anesthesiologist, CRNA and Surgeon  Anesthesia Plan Comments:        Anesthesia Quick Evaluation

## 2022-04-17 LAB — SURGICAL PATHOLOGY

## 2022-04-20 ENCOUNTER — Encounter: Payer: Self-pay | Admitting: Urology

## 2022-04-22 LAB — COLOGUARD: COLOGUARD: NEGATIVE

## 2022-05-06 ENCOUNTER — Telehealth: Payer: Self-pay | Admitting: *Deleted

## 2022-05-06 ENCOUNTER — Ambulatory Visit
Admission: RE | Admit: 2022-05-06 | Discharge: 2022-05-06 | Disposition: A | Discharge status: Home / Self Care | Payer: Medicare HMO | Source: Ambulatory Visit | Attending: Radiation Oncology | Admitting: Radiation Oncology

## 2022-05-06 VITALS — BP 107/64 | HR 60 | Temp 98.3°F | Resp 16 | Ht 70.0 in | Wt 164.9 lb

## 2022-05-06 DIAGNOSIS — E785 Hyperlipidemia, unspecified: Secondary | ICD-10-CM | POA: Insufficient documentation

## 2022-05-06 DIAGNOSIS — I252 Old myocardial infarction: Secondary | ICD-10-CM | POA: Diagnosis not present

## 2022-05-06 DIAGNOSIS — I10 Essential (primary) hypertension: Secondary | ICD-10-CM | POA: Diagnosis not present

## 2022-05-06 DIAGNOSIS — Z7902 Long term (current) use of antithrombotics/antiplatelets: Secondary | ICD-10-CM | POA: Insufficient documentation

## 2022-05-06 DIAGNOSIS — Z87891 Personal history of nicotine dependence: Secondary | ICD-10-CM | POA: Diagnosis not present

## 2022-05-06 DIAGNOSIS — I2510 Atherosclerotic heart disease of native coronary artery without angina pectoris: Secondary | ICD-10-CM | POA: Insufficient documentation

## 2022-05-06 DIAGNOSIS — Z7982 Long term (current) use of aspirin: Secondary | ICD-10-CM | POA: Insufficient documentation

## 2022-05-06 DIAGNOSIS — Z8719 Personal history of other diseases of the digestive system: Secondary | ICD-10-CM | POA: Diagnosis not present

## 2022-05-06 DIAGNOSIS — M1990 Unspecified osteoarthritis, unspecified site: Secondary | ICD-10-CM | POA: Insufficient documentation

## 2022-05-06 DIAGNOSIS — J449 Chronic obstructive pulmonary disease, unspecified: Secondary | ICD-10-CM | POA: Diagnosis not present

## 2022-05-06 DIAGNOSIS — C61 Malignant neoplasm of prostate: Secondary | ICD-10-CM | POA: Insufficient documentation

## 2022-05-06 DIAGNOSIS — K589 Irritable bowel syndrome without diarrhea: Secondary | ICD-10-CM | POA: Diagnosis not present

## 2022-05-06 NOTE — Telephone Encounter (Signed)
Called Dr. Samuel Germany office to schedule Eligard injection and for marker placement. Angie at Dr. Samuel Germany office stated she would call patient with appointment. Patient given markers.

## 2022-05-06 NOTE — Consult Note (Signed)
NEW PATIENT EVALUATION  Name: Corey Jimenez  MRN: [id number]  Date:   05/06/2022     DOB: December 20, 1945   This 77 y.o. 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  REFERRING PHYSICIAN: Albina Billet, MD  CHIEF COMPLAINT:  Chief Complaint  Patient presents with   Prostate Cancer    DIAGNOSIS: The encounter diagnosis was Malignant neoplasm of prostate ([place]).   PREVIOUS INVESTIGATIONS:  MRI scan reviewed Pathology reports reviewed Clinical notes reviewed  HPI: Patient is a 76 year old male who presents with an elevated PSA of 7.3.  MRI scan of his prostate showed a suspicious lesion in the posterior right mid gland concerning for high-grade carcinoma PI-RADS 4.  He had an enlarged new nodular transitional zone consistent with BPH.  He underwent transrectal ultrasound-guided biopsy showing 5 out of 13 cores positive for adenocarcinoma a mixture of Gleason 7 (4+3) Gleason 8 (4+4).  His prostate biopsies were delayed secondary to requiring a stent placement status post MI.  His digital rectal exam showed a smooth prostate with no discernible nodularity.  He had a 44 cc prostate.  Patient is asymptomatic does occasionally have nocturia x2-3 no specific urgency or frequency.  Bowel function is normal.  He is having no bone pain.  He is now referred to radiation oncology for opinion. PLANNED TREATMENT REGIMEN: Image guided IMRT radiation therapy with ADT  PAST MEDICAL HISTORY:  has a past medical history of Asthma (08/31/1993), BPH (benign prostatic hyperplasia), CAD (coronary artery disease) (11/03/2021), Chronic bronchitis ([place]), Complication of anesthesia, Elevated PSA, Hemorrhoids, HLD (hyperlipidemia), HTN (hypertension), Irritable bowel disease, Long term current use of antithrombotics/antiplatelets, Mild hearing loss, NSTEMI (non-ST elevated myocardial infarction) ([place]) (11/03/2021),  Osteoarthritis, Perforation of sigmoid colon ([place]) (2014), and Personal history of colonic polyps.    PAST SURGICAL HISTORY:  Past Surgical History:  Procedure Laterality Date   BLEPHAROPLASTY     COLONOSCOPY  2008, 2011,2014   Dr Bary Castilla   CORONARY ANGIOPLASTY WITH STENT PLACEMENT Left 11/03/2021   Procedure: CORONARY ANGIOPLASTY WITH STENT PLACEMENT; Location: [place]; Surgeon: Juanda Chance, MD   HEMORRHOID SURGERY N/A 2012   PROSTATE BIOPSY N/A 04/16/2022   Procedure: PROSTATE BIOPSY  URO NAV;  Surgeon: Royston Cowper, MD;  Location: [facility];  Service: Urology;  Laterality: N/A;   TONSILLECTOMY Bilateral 1951    FAMILY HISTORY: family history includes Parkinson's disease in his father.  SOCIAL HISTORY:  reports that he quit smoking about 41 years ago. His smoking use included cigarettes. He has quit using smokeless tobacco.  His smokeless tobacco use included chew. He reports current alcohol use. He reports that he does not use drugs.  ALLERGIES: Patient has no known allergies.  MEDICATIONS:  Current Outpatient Medications  Medication Sig Dispense Refill   acetaminophen (TYLENOL) 500 MG tablet Take 500 mg by mouth every 6 (six) hours as needed for mild pain.     albuterol (PROVENTIL HFA;VENTOLIN HFA) 108 (90 Base) MCG/ACT inhaler Inhale 2 puffs into the lungs every 6 (six) hours as needed for wheezing or shortness of breath. 3 Inhaler 2   aspirin EC 81 MG tablet Take 81 mg by mouth daily. Swallow whole.     atorvastatin (LIPITOR) 40 MG tablet Take 40 mg by mouth every evening.     dutasteride (AVODART) 0.5 MG capsule Take 0.5 mg by mouth daily.  ezetimibe (ZETIA) 10 MG tablet Take 10 mg by mouth at bedtime.     metoprolol tartrate (LOPRESSOR) 25 MG tablet Take 12.5 mg by mouth 2 (two) times daily.     Multiple Vitamin (MULTIVITAMIN WITH MINERALS) TABS tablet Take 1 tablet by mouth daily.     mupirocin ointment (BACTROBAN) 2 % Apply 1 application topically 2  (two) times daily as needed (irritation).     Psyllium (METAMUCIL PO) Take 1 Dose by mouth daily. 1 dose = 4 teaspoons     ticagrelor (BRILINTA) 90 MG TABS tablet Take 90 mg by mouth 2 (two) times daily.     Vitamin D, Ergocalciferol, (DRISDOL) 1.25 MG (50000 UNIT) CAPS capsule Take 50,000 Units by mouth every Saturday.     guaiFENesin-codeine 100-10 MG/5ML syrup Take 5 mLs by mouth every 4 (four) hours as needed for cough. Given by Dr Hall Busing (Patient not taking: Reported on 04/07/2022)     No current facility-administered medications for this encounter.    ECOG PERFORMANCE STATUS:  0 - Asymptomatic  REVIEW OF SYSTEMS: Patient had a recent stent placement for MI Patient denies any weight loss, fatigue, weakness, fever, chills or night sweats. Patient denies any loss of vision, blurred vision. Patient denies any ringing  of the ears or hearing loss. No irregular heartbeat. Patient denies heart murmur or history of fainting. Patient denies any chest pain or pain radiating to her upper extremities. Patient denies any shortness of breath, difficulty breathing at night, cough or hemoptysis. Patient denies any swelling in the lower legs. Patient denies any nausea vomiting, vomiting of blood, or coffee ground material in the vomitus. Patient denies any stomach pain. Patient states has had normal bowel movements no significant constipation or diarrhea. Patient denies any dysuria, hematuria or significant nocturia. Patient denies any problems walking, swelling in the joints or loss of balance. Patient denies any skin changes, loss of hair or loss of weight. Patient denies any excessive worrying or anxiety or significant depression. Patient denies any problems with insomnia. Patient denies excessive thirst, polyuria, polydipsia. Patient denies any swollen glands, patient denies easy bruising or easy bleeding. Patient denies any recent infections, allergies or URI. Patient "s visual fields have not changed  significantly in recent time.   PHYSICAL EXAM: BP 107/64 (BP Location: Right Arm, Patient Position: Sitting, Cuff Size: Normal)   Pulse 60   Temp 98.3 F (36.8 C) (Tympanic)   Resp 16   Ht '5\' 10"'$  (1.778 m) Comment: stated ht  Wt 164 lb 14.4 oz (74.8 kg)   BMI 23.66 kg/m  Well-developed well-nourished patient in NAD. HEENT reveals PERLA, EOMI, discs not visualized.  Oral cavity is clear. No oral mucosal lesions are identified. Neck is clear without evidence of cervical or supraclavicular adenopathy. Lungs are clear to A&P. Cardiac examination is essentially unremarkable with regular rate and rhythm without murmur rub or thrill. Abdomen is benign with no organomegaly or masses noted. Motor sensory and DTR levels are equal and symmetric in the upper and lower extremities. Cranial nerves II through XII are grossly intact. Proprioception is intact. No peripheral adenopathy or edema is identified. No motor or sensory levels are noted. Crude visual fields are within normal range.  LABORATORY DATA: Pathology reports reviewed    RADIOLOGY RESULTS: MRI scan reviewed compatible with above-stated findings   IMPRESSION: Stage IIc Gleason 8 adenocarcinoma the prostate in 76 year old male  PLAN: At this time of gone over treatment recommendations with the patient.  Based on his recent MRI  I am not inclined to go ahead with general anesthesia for seed implantation.  I have explained to him the outcome of external beam radiation therapy is equivalent.  I have recommended image guided IMRT radiation therapy to his prostate.  We will plan on delivering 80 Gray in 8 weeks.  Risks and benefits of treatment including increased lower urinary tract symptoms diarrhea fatigue alteration of blood counts possible skin reaction all were described in detail to the patient and his wife.  I have asked Dr. Eliberto Ivory to place fiducial markers in his prostate and also go ahead with a 17-monthEligard injection.  We will set up  simulation after Dr. WEliberto Ivoryhas performed those procedures.  I would like to take this opportunity to thank you for allowing me to participate in the care of your patient..Noreene Filbert MD

## 2022-05-20 ENCOUNTER — Encounter: Payer: Self-pay | Admitting: Urology

## 2022-05-28 ENCOUNTER — Ambulatory Visit
Admission: RE | Admit: 2022-05-28 | Discharge: 2022-05-28 | Disposition: A | Discharge status: Home / Self Care | Payer: Medicare HMO | Source: Ambulatory Visit | Attending: Radiation Oncology | Admitting: Radiation Oncology

## 2022-05-28 DIAGNOSIS — C61 Malignant neoplasm of prostate: Secondary | ICD-10-CM | POA: Diagnosis present

## 2022-05-28 DIAGNOSIS — Z510 Encounter for antineoplastic radiation therapy: Secondary | ICD-10-CM | POA: Insufficient documentation

## 2022-05-29 DIAGNOSIS — Z510 Encounter for antineoplastic radiation therapy: Secondary | ICD-10-CM | POA: Diagnosis not present

## 2022-06-05 ENCOUNTER — Other Ambulatory Visit: Payer: Self-pay | Admitting: *Deleted

## 2022-06-05 DIAGNOSIS — C61 Malignant neoplasm of prostate: Secondary | ICD-10-CM

## 2022-06-08 ENCOUNTER — Ambulatory Visit: Payer: Medicare Other

## 2022-06-09 ENCOUNTER — Ambulatory Visit
Admission: RE | Admit: 2022-06-09 | Discharge: 2022-06-09 | Disposition: A | Discharge status: Home / Self Care | Payer: Medicare HMO | Source: Ambulatory Visit | Attending: Radiation Oncology | Admitting: Radiation Oncology

## 2022-06-09 ENCOUNTER — Ambulatory Visit: Payer: Medicare Other

## 2022-06-09 DIAGNOSIS — C61 Malignant neoplasm of prostate: Secondary | ICD-10-CM | POA: Insufficient documentation

## 2022-06-09 DIAGNOSIS — Z510 Encounter for antineoplastic radiation therapy: Secondary | ICD-10-CM | POA: Insufficient documentation

## 2022-06-10 ENCOUNTER — Ambulatory Visit
Admission: RE | Admit: 2022-06-10 | Discharge: 2022-06-10 | Disposition: A | Discharge status: Home / Self Care | Payer: Medicare HMO | Source: Ambulatory Visit | Attending: Radiation Oncology | Admitting: Radiation Oncology

## 2022-06-10 ENCOUNTER — Other Ambulatory Visit: Payer: Self-pay

## 2022-06-10 ENCOUNTER — Ambulatory Visit: Payer: Medicare Other

## 2022-06-10 DIAGNOSIS — Z510 Encounter for antineoplastic radiation therapy: Secondary | ICD-10-CM | POA: Diagnosis present

## 2022-06-10 DIAGNOSIS — C61 Malignant neoplasm of prostate: Secondary | ICD-10-CM | POA: Diagnosis present

## 2022-06-10 LAB — RAD ONC ARIA SESSION SUMMARY
Course Elapsed Days: 0
Plan Fractions Treated to Date: 1
Plan Prescribed Dose Per Fraction: 2.5 Gy
Plan Total Fractions Prescribed: 28
Plan Total Prescribed Dose: 70 Gy
Reference Point Dosage Given to Date: 2.5 Gy
Reference Point Session Dosage Given: 2.5 Gy
Session Number: 1

## 2022-06-11 ENCOUNTER — Other Ambulatory Visit: Payer: Self-pay

## 2022-06-11 ENCOUNTER — Ambulatory Visit: Payer: Medicare Other

## 2022-06-11 ENCOUNTER — Ambulatory Visit
Admission: RE | Admit: 2022-06-11 | Discharge: 2022-06-11 | Disposition: A | Discharge status: Home / Self Care | Payer: Medicare HMO | Source: Ambulatory Visit | Attending: Radiation Oncology | Admitting: Radiation Oncology

## 2022-06-11 DIAGNOSIS — Z510 Encounter for antineoplastic radiation therapy: Secondary | ICD-10-CM | POA: Diagnosis not present

## 2022-06-11 LAB — RAD ONC ARIA SESSION SUMMARY
Course Elapsed Days: 1
Plan Fractions Treated to Date: 2
Plan Prescribed Dose Per Fraction: 2.5 Gy
Plan Total Fractions Prescribed: 28
Plan Total Prescribed Dose: 70 Gy
Reference Point Dosage Given to Date: 5 Gy
Reference Point Session Dosage Given: 2.5 Gy
Session Number: 2

## 2022-06-12 ENCOUNTER — Ambulatory Visit: Payer: Medicare Other

## 2022-06-12 ENCOUNTER — Ambulatory Visit
Admission: RE | Admit: 2022-06-12 | Discharge: 2022-06-12 | Disposition: A | Discharge status: Home / Self Care | Payer: Medicare HMO | Source: Ambulatory Visit | Attending: Radiation Oncology | Admitting: Radiation Oncology

## 2022-06-12 ENCOUNTER — Other Ambulatory Visit: Payer: Self-pay

## 2022-06-12 DIAGNOSIS — Z510 Encounter for antineoplastic radiation therapy: Secondary | ICD-10-CM | POA: Diagnosis not present

## 2022-06-12 LAB — RAD ONC ARIA SESSION SUMMARY
Course Elapsed Days: 2
Plan Fractions Treated to Date: 3
Plan Prescribed Dose Per Fraction: 2.5 Gy
Plan Total Fractions Prescribed: 28
Plan Total Prescribed Dose: 70 Gy
Reference Point Dosage Given to Date: 7.5 Gy
Reference Point Session Dosage Given: 2.5 Gy
Session Number: 3

## 2022-06-15 ENCOUNTER — Other Ambulatory Visit: Payer: Self-pay

## 2022-06-15 ENCOUNTER — Ambulatory Visit
Admission: RE | Admit: 2022-06-15 | Discharge: 2022-06-15 | Disposition: A | Discharge status: Home / Self Care | Payer: Medicare HMO | Source: Ambulatory Visit | Attending: Radiation Oncology | Admitting: Radiation Oncology

## 2022-06-15 ENCOUNTER — Ambulatory Visit: Payer: Medicare Other

## 2022-06-15 DIAGNOSIS — Z510 Encounter for antineoplastic radiation therapy: Secondary | ICD-10-CM | POA: Diagnosis not present

## 2022-06-15 LAB — RAD ONC ARIA SESSION SUMMARY
Course Elapsed Days: 5
Plan Fractions Treated to Date: 4
Plan Prescribed Dose Per Fraction: 2.5 Gy
Plan Total Fractions Prescribed: 28
Plan Total Prescribed Dose: 70 Gy
Reference Point Dosage Given to Date: 10 Gy
Reference Point Session Dosage Given: 2.5 Gy
Session Number: 4

## 2022-06-16 ENCOUNTER — Other Ambulatory Visit: Payer: Self-pay

## 2022-06-16 ENCOUNTER — Ambulatory Visit
Admission: RE | Admit: 2022-06-16 | Discharge: 2022-06-16 | Disposition: A | Discharge status: Home / Self Care | Payer: Medicare HMO | Source: Ambulatory Visit | Attending: Radiation Oncology | Admitting: Radiation Oncology

## 2022-06-16 ENCOUNTER — Ambulatory Visit: Payer: Medicare Other

## 2022-06-16 DIAGNOSIS — Z510 Encounter for antineoplastic radiation therapy: Secondary | ICD-10-CM | POA: Diagnosis not present

## 2022-06-16 LAB — RAD ONC ARIA SESSION SUMMARY
Course Elapsed Days: 6
Plan Fractions Treated to Date: 5
Plan Prescribed Dose Per Fraction: 2.5 Gy
Plan Total Fractions Prescribed: 28
Plan Total Prescribed Dose: 70 Gy
Reference Point Dosage Given to Date: 12.5 Gy
Reference Point Session Dosage Given: 2.5 Gy
Session Number: 5

## 2022-06-17 ENCOUNTER — Other Ambulatory Visit: Payer: Self-pay

## 2022-06-17 ENCOUNTER — Ambulatory Visit: Payer: Medicare Other

## 2022-06-17 ENCOUNTER — Ambulatory Visit
Admission: RE | Admit: 2022-06-17 | Discharge: 2022-06-17 | Disposition: A | Discharge status: Home / Self Care | Payer: Medicare HMO | Source: Ambulatory Visit | Attending: Radiation Oncology | Admitting: Radiation Oncology

## 2022-06-17 DIAGNOSIS — Z510 Encounter for antineoplastic radiation therapy: Secondary | ICD-10-CM | POA: Diagnosis not present

## 2022-06-17 LAB — RAD ONC ARIA SESSION SUMMARY
Course Elapsed Days: 7
Plan Fractions Treated to Date: 6
Plan Prescribed Dose Per Fraction: 2.5 Gy
Plan Total Fractions Prescribed: 28
Plan Total Prescribed Dose: 70 Gy
Reference Point Dosage Given to Date: 15 Gy
Reference Point Session Dosage Given: 2.5 Gy
Session Number: 6

## 2022-06-18 ENCOUNTER — Other Ambulatory Visit: Payer: Self-pay

## 2022-06-18 ENCOUNTER — Ambulatory Visit
Admission: RE | Admit: 2022-06-18 | Discharge: 2022-06-18 | Disposition: A | Discharge status: Home / Self Care | Payer: Medicare HMO | Source: Ambulatory Visit | Attending: Radiation Oncology | Admitting: Radiation Oncology

## 2022-06-18 ENCOUNTER — Ambulatory Visit: Payer: Medicare Other

## 2022-06-18 DIAGNOSIS — Z510 Encounter for antineoplastic radiation therapy: Secondary | ICD-10-CM | POA: Diagnosis not present

## 2022-06-18 LAB — RAD ONC ARIA SESSION SUMMARY
Course Elapsed Days: 8
Plan Fractions Treated to Date: 7
Plan Prescribed Dose Per Fraction: 2.5 Gy
Plan Total Fractions Prescribed: 28
Plan Total Prescribed Dose: 70 Gy
Reference Point Dosage Given to Date: 17.5 Gy
Reference Point Session Dosage Given: 2.5 Gy
Session Number: 7

## 2022-06-19 ENCOUNTER — Ambulatory Visit: Payer: Medicare Other

## 2022-06-19 ENCOUNTER — Other Ambulatory Visit: Payer: Self-pay

## 2022-06-19 ENCOUNTER — Ambulatory Visit
Admission: RE | Admit: 2022-06-19 | Discharge: 2022-06-19 | Disposition: A | Discharge status: Home / Self Care | Payer: Medicare HMO | Source: Ambulatory Visit | Attending: Radiation Oncology | Admitting: Radiation Oncology

## 2022-06-19 DIAGNOSIS — Z510 Encounter for antineoplastic radiation therapy: Secondary | ICD-10-CM | POA: Diagnosis not present

## 2022-06-19 LAB — RAD ONC ARIA SESSION SUMMARY
Course Elapsed Days: 9
Plan Fractions Treated to Date: 8
Plan Prescribed Dose Per Fraction: 2.5 Gy
Plan Total Fractions Prescribed: 28
Plan Total Prescribed Dose: 70 Gy
Reference Point Dosage Given to Date: 20 Gy
Reference Point Session Dosage Given: 2.5 Gy
Session Number: 8

## 2022-06-22 ENCOUNTER — Inpatient Hospital Stay: Payer: Medicare HMO | Attending: Radiation Oncology

## 2022-06-22 ENCOUNTER — Ambulatory Visit
Admission: RE | Admit: 2022-06-22 | Discharge: 2022-06-22 | Disposition: A | Discharge status: Home / Self Care | Payer: Medicare HMO | Source: Ambulatory Visit | Attending: Radiation Oncology | Admitting: Radiation Oncology

## 2022-06-22 ENCOUNTER — Ambulatory Visit: Payer: Medicare Other

## 2022-06-22 ENCOUNTER — Other Ambulatory Visit: Payer: Self-pay

## 2022-06-22 DIAGNOSIS — C61 Malignant neoplasm of prostate: Secondary | ICD-10-CM | POA: Diagnosis present

## 2022-06-22 DIAGNOSIS — Z510 Encounter for antineoplastic radiation therapy: Secondary | ICD-10-CM | POA: Diagnosis not present

## 2022-06-22 LAB — RAD ONC ARIA SESSION SUMMARY
Course Elapsed Days: 12
Plan Fractions Treated to Date: 9
Plan Prescribed Dose Per Fraction: 2.5 Gy
Plan Total Fractions Prescribed: 28
Plan Total Prescribed Dose: 70 Gy
Reference Point Dosage Given to Date: 22.5 Gy
Reference Point Session Dosage Given: 2.5 Gy
Session Number: 9

## 2022-06-22 LAB — CBC
HCT: 37.2 % — ABNORMAL LOW (ref 39.0–52.0)
Hemoglobin: 12.6 g/dL — ABNORMAL LOW (ref 13.0–17.0)
MCH: 30.9 pg (ref 26.0–34.0)
MCHC: 33.9 g/dL (ref 30.0–36.0)
MCV: 91.2 fL (ref 80.0–100.0)
Platelets: 216 10*3/uL (ref 150–400)
RBC: 4.08 MIL/uL — ABNORMAL LOW (ref 4.22–5.81)
RDW: 13.4 % (ref 11.5–15.5)
WBC: 4.7 10*3/uL (ref 4.0–10.5)
nRBC: 0 % (ref 0.0–0.2)

## 2022-06-23 ENCOUNTER — Ambulatory Visit
Admission: RE | Admit: 2022-06-23 | Discharge: 2022-06-23 | Disposition: A | Discharge status: Home / Self Care | Payer: Medicare HMO | Source: Ambulatory Visit | Attending: Radiation Oncology | Admitting: Radiation Oncology

## 2022-06-23 ENCOUNTER — Other Ambulatory Visit: Payer: Self-pay

## 2022-06-23 ENCOUNTER — Ambulatory Visit: Payer: Medicare Other

## 2022-06-23 DIAGNOSIS — Z510 Encounter for antineoplastic radiation therapy: Secondary | ICD-10-CM | POA: Diagnosis not present

## 2022-06-23 LAB — RAD ONC ARIA SESSION SUMMARY
Course Elapsed Days: 13
Plan Fractions Treated to Date: 10
Plan Prescribed Dose Per Fraction: 2.5 Gy
Plan Total Fractions Prescribed: 28
Plan Total Prescribed Dose: 70 Gy
Reference Point Dosage Given to Date: 25 Gy
Reference Point Session Dosage Given: 2.5 Gy
Session Number: 10

## 2022-06-24 ENCOUNTER — Ambulatory Visit
Admission: RE | Admit: 2022-06-24 | Discharge: 2022-06-24 | Disposition: A | Discharge status: Home / Self Care | Payer: Medicare HMO | Source: Ambulatory Visit | Attending: Radiation Oncology | Admitting: Radiation Oncology

## 2022-06-24 ENCOUNTER — Ambulatory Visit: Payer: Medicare Other

## 2022-06-24 ENCOUNTER — Other Ambulatory Visit: Payer: Self-pay

## 2022-06-24 DIAGNOSIS — Z510 Encounter for antineoplastic radiation therapy: Secondary | ICD-10-CM | POA: Diagnosis not present

## 2022-06-24 LAB — RAD ONC ARIA SESSION SUMMARY
Course Elapsed Days: 14
Plan Fractions Treated to Date: 11
Plan Prescribed Dose Per Fraction: 2.5 Gy
Plan Total Fractions Prescribed: 28
Plan Total Prescribed Dose: 70 Gy
Reference Point Dosage Given to Date: 27.5 Gy
Reference Point Session Dosage Given: 2.5 Gy
Session Number: 11

## 2022-06-25 ENCOUNTER — Ambulatory Visit: Payer: Medicare Other

## 2022-06-25 ENCOUNTER — Other Ambulatory Visit: Payer: Self-pay

## 2022-06-25 ENCOUNTER — Ambulatory Visit
Admission: RE | Admit: 2022-06-25 | Discharge: 2022-06-25 | Disposition: A | Discharge status: Home / Self Care | Payer: Medicare HMO | Source: Ambulatory Visit | Attending: Radiation Oncology | Admitting: Radiation Oncology

## 2022-06-25 DIAGNOSIS — Z510 Encounter for antineoplastic radiation therapy: Secondary | ICD-10-CM | POA: Diagnosis not present

## 2022-06-25 LAB — RAD ONC ARIA SESSION SUMMARY
Course Elapsed Days: 15
Plan Fractions Treated to Date: 12
Plan Prescribed Dose Per Fraction: 2.5 Gy
Plan Total Fractions Prescribed: 28
Plan Total Prescribed Dose: 70 Gy
Reference Point Dosage Given to Date: 30 Gy
Reference Point Session Dosage Given: 2.5 Gy
Session Number: 12

## 2022-06-26 ENCOUNTER — Other Ambulatory Visit: Payer: Self-pay

## 2022-06-26 ENCOUNTER — Ambulatory Visit: Payer: Medicare Other

## 2022-06-26 ENCOUNTER — Ambulatory Visit
Admission: RE | Admit: 2022-06-26 | Discharge: 2022-06-26 | Disposition: A | Discharge status: Home / Self Care | Payer: Medicare HMO | Source: Ambulatory Visit | Attending: Radiation Oncology | Admitting: Radiation Oncology

## 2022-06-26 DIAGNOSIS — Z510 Encounter for antineoplastic radiation therapy: Secondary | ICD-10-CM | POA: Diagnosis not present

## 2022-06-26 LAB — RAD ONC ARIA SESSION SUMMARY
Course Elapsed Days: 16
Plan Fractions Treated to Date: 13
Plan Prescribed Dose Per Fraction: 2.5 Gy
Plan Total Fractions Prescribed: 28
Plan Total Prescribed Dose: 70 Gy
Reference Point Dosage Given to Date: 32.5 Gy
Reference Point Session Dosage Given: 2.5 Gy
Session Number: 13

## 2022-06-29 ENCOUNTER — Ambulatory Visit: Payer: Medicare Other

## 2022-06-29 ENCOUNTER — Other Ambulatory Visit: Payer: Self-pay

## 2022-06-29 ENCOUNTER — Ambulatory Visit
Admission: RE | Admit: 2022-06-29 | Discharge: 2022-06-29 | Disposition: A | Discharge status: Home / Self Care | Payer: Medicare HMO | Source: Ambulatory Visit | Attending: Radiation Oncology | Admitting: Radiation Oncology

## 2022-06-29 DIAGNOSIS — Z510 Encounter for antineoplastic radiation therapy: Secondary | ICD-10-CM | POA: Diagnosis not present

## 2022-06-29 LAB — RAD ONC ARIA SESSION SUMMARY
Course Elapsed Days: 19
Plan Fractions Treated to Date: 14
Plan Prescribed Dose Per Fraction: 2.5 Gy
Plan Total Fractions Prescribed: 28
Plan Total Prescribed Dose: 70 Gy
Reference Point Dosage Given to Date: 35 Gy
Reference Point Session Dosage Given: 2.5 Gy
Session Number: 14

## 2022-06-30 ENCOUNTER — Ambulatory Visit: Payer: Medicare Other

## 2022-06-30 ENCOUNTER — Ambulatory Visit
Admission: RE | Admit: 2022-06-30 | Discharge: 2022-06-30 | Disposition: A | Discharge status: Home / Self Care | Payer: Medicare HMO | Source: Ambulatory Visit | Attending: Radiation Oncology | Admitting: Radiation Oncology

## 2022-06-30 ENCOUNTER — Other Ambulatory Visit: Payer: Self-pay

## 2022-06-30 DIAGNOSIS — Z510 Encounter for antineoplastic radiation therapy: Secondary | ICD-10-CM | POA: Diagnosis not present

## 2022-06-30 LAB — RAD ONC ARIA SESSION SUMMARY
Course Elapsed Days: 20
Plan Fractions Treated to Date: 15
Plan Prescribed Dose Per Fraction: 2.5 Gy
Plan Total Fractions Prescribed: 28
Plan Total Prescribed Dose: 70 Gy
Reference Point Dosage Given to Date: 37.5 Gy
Reference Point Session Dosage Given: 2.5 Gy
Session Number: 15

## 2022-07-01 ENCOUNTER — Other Ambulatory Visit: Payer: Self-pay

## 2022-07-01 ENCOUNTER — Ambulatory Visit
Admission: RE | Admit: 2022-07-01 | Discharge: 2022-07-01 | Disposition: A | Discharge status: Home / Self Care | Payer: Medicare HMO | Source: Ambulatory Visit | Attending: Radiation Oncology | Admitting: Radiation Oncology

## 2022-07-01 ENCOUNTER — Ambulatory Visit: Payer: Medicare HMO

## 2022-07-01 DIAGNOSIS — Z510 Encounter for antineoplastic radiation therapy: Secondary | ICD-10-CM | POA: Insufficient documentation

## 2022-07-01 DIAGNOSIS — C61 Malignant neoplasm of prostate: Secondary | ICD-10-CM | POA: Insufficient documentation

## 2022-07-01 LAB — RAD ONC ARIA SESSION SUMMARY
Course Elapsed Days: 21
Plan Fractions Treated to Date: 16
Plan Prescribed Dose Per Fraction: 2.5 Gy
Plan Total Fractions Prescribed: 28
Plan Total Prescribed Dose: 70 Gy
Reference Point Dosage Given to Date: 40 Gy
Reference Point Session Dosage Given: 2.5 Gy
Session Number: 16

## 2022-07-02 ENCOUNTER — Ambulatory Visit: Payer: Medicare HMO

## 2022-07-02 ENCOUNTER — Ambulatory Visit
Admission: RE | Admit: 2022-07-02 | Discharge: 2022-07-02 | Disposition: A | Discharge status: Home / Self Care | Payer: Medicare HMO | Source: Ambulatory Visit | Attending: Radiation Oncology | Admitting: Radiation Oncology

## 2022-07-02 ENCOUNTER — Other Ambulatory Visit: Payer: Self-pay

## 2022-07-02 DIAGNOSIS — Z510 Encounter for antineoplastic radiation therapy: Secondary | ICD-10-CM | POA: Diagnosis not present

## 2022-07-02 LAB — RAD ONC ARIA SESSION SUMMARY
Course Elapsed Days: 22
Plan Fractions Treated to Date: 17
Plan Prescribed Dose Per Fraction: 2.5 Gy
Plan Total Fractions Prescribed: 28
Plan Total Prescribed Dose: 70 Gy
Reference Point Dosage Given to Date: 42.5 Gy
Reference Point Session Dosage Given: 2.5 Gy
Session Number: 17

## 2022-07-03 ENCOUNTER — Other Ambulatory Visit: Payer: Self-pay

## 2022-07-03 ENCOUNTER — Ambulatory Visit: Payer: Medicare HMO

## 2022-07-03 ENCOUNTER — Ambulatory Visit
Admission: RE | Admit: 2022-07-03 | Discharge: 2022-07-03 | Disposition: A | Discharge status: Home / Self Care | Payer: Medicare HMO | Source: Ambulatory Visit | Attending: Radiation Oncology | Admitting: Radiation Oncology

## 2022-07-03 DIAGNOSIS — Z510 Encounter for antineoplastic radiation therapy: Secondary | ICD-10-CM | POA: Diagnosis not present

## 2022-07-03 LAB — RAD ONC ARIA SESSION SUMMARY
Course Elapsed Days: 23
Plan Fractions Treated to Date: 18
Plan Prescribed Dose Per Fraction: 2.5 Gy
Plan Total Fractions Prescribed: 28
Plan Total Prescribed Dose: 70 Gy
Reference Point Dosage Given to Date: 45 Gy
Reference Point Session Dosage Given: 2.5 Gy
Session Number: 18

## 2022-07-06 ENCOUNTER — Ambulatory Visit
Admission: RE | Admit: 2022-07-06 | Discharge: 2022-07-06 | Disposition: A | Discharge status: Home / Self Care | Payer: Medicare HMO | Source: Ambulatory Visit | Attending: Radiation Oncology | Admitting: Radiation Oncology

## 2022-07-06 ENCOUNTER — Inpatient Hospital Stay: Payer: Medicare HMO | Attending: Radiation Oncology

## 2022-07-06 ENCOUNTER — Ambulatory Visit: Payer: Medicare HMO

## 2022-07-06 ENCOUNTER — Other Ambulatory Visit: Payer: Self-pay

## 2022-07-06 DIAGNOSIS — C61 Malignant neoplasm of prostate: Secondary | ICD-10-CM | POA: Insufficient documentation

## 2022-07-06 DIAGNOSIS — Z510 Encounter for antineoplastic radiation therapy: Secondary | ICD-10-CM | POA: Diagnosis not present

## 2022-07-06 LAB — RAD ONC ARIA SESSION SUMMARY
Course Elapsed Days: 26
Plan Fractions Treated to Date: 19
Plan Prescribed Dose Per Fraction: 2.5 Gy
Plan Total Fractions Prescribed: 28
Plan Total Prescribed Dose: 70 Gy
Reference Point Dosage Given to Date: 47.5 Gy
Reference Point Session Dosage Given: 2.5 Gy
Session Number: 19

## 2022-07-06 LAB — CBC
HCT: 40.1 % (ref 39.0–52.0)
Hemoglobin: 13.5 g/dL (ref 13.0–17.0)
MCH: 30.4 pg (ref 26.0–34.0)
MCHC: 33.7 g/dL (ref 30.0–36.0)
MCV: 90.3 fL (ref 80.0–100.0)
Platelets: 212 10*3/uL (ref 150–400)
RBC: 4.44 MIL/uL (ref 4.22–5.81)
RDW: 13.3 % (ref 11.5–15.5)
WBC: 5 10*3/uL (ref 4.0–10.5)
nRBC: 0 % (ref 0.0–0.2)

## 2022-07-07 ENCOUNTER — Other Ambulatory Visit: Payer: Self-pay

## 2022-07-07 ENCOUNTER — Ambulatory Visit: Payer: Medicare HMO

## 2022-07-07 ENCOUNTER — Ambulatory Visit (LOCAL_COMMUNITY_HEALTH_CENTER): Payer: Medicare Other

## 2022-07-07 ENCOUNTER — Ambulatory Visit
Admission: RE | Admit: 2022-07-07 | Discharge: 2022-07-07 | Disposition: A | Discharge status: Home / Self Care | Payer: Medicare HMO | Source: Ambulatory Visit | Attending: Radiation Oncology | Admitting: Radiation Oncology

## 2022-07-07 DIAGNOSIS — Z23 Encounter for immunization: Secondary | ICD-10-CM | POA: Diagnosis not present

## 2022-07-07 DIAGNOSIS — Z510 Encounter for antineoplastic radiation therapy: Secondary | ICD-10-CM | POA: Diagnosis not present

## 2022-07-07 DIAGNOSIS — Z719 Counseling, unspecified: Secondary | ICD-10-CM

## 2022-07-07 LAB — RAD ONC ARIA SESSION SUMMARY
Course Elapsed Days: 27
Plan Fractions Treated to Date: 20
Plan Prescribed Dose Per Fraction: 2.5 Gy
Plan Total Fractions Prescribed: 28
Plan Total Prescribed Dose: 70 Gy
Reference Point Dosage Given to Date: 50 Gy
Reference Point Session Dosage Given: 2.5 Gy
Session Number: 20

## 2022-07-07 NOTE — Progress Notes (Signed)
  Are you feeling sick today? No   Have you ever received a dose of COVID-19 Vaccine? [employer], [place], [place], [place], Other) Yes  If yes, which vaccine and how many doses?    Pfizer 5 doses and Moderna 1 dose  Did you bring the vaccination record card or other documentation?  Yes   Do you have a health condition or are undergoing treatment that makes you moderately or severely immunocompromised? This would include, but not be limited to: cancer, HIV, organ transplant, immunosuppressive therapy/high-dose corticosteroids, or moderate/severe primary immunodeficiency.  No  Have you received COVID-19 vaccine before or during hematopoietic cell transplant (HCT) or CAR-T-cell therapies? No  Have you ever had an allergic reaction to: (This would include a severe allergic reaction or a reaction that caused hives, swelling, or respiratory distress, including wheezing.) A component of a COVID-19 vaccine or a previous dose of COVID-19 vaccine? No   Have you ever had an allergic reaction to another vaccine (other thanCOVID-19 vaccine) or an injectable medication? (This would include a severe allergic reaction or a reaction that caused hives, swelling, or respiratory distress, including wheezing.)   No    Do you have a history of any of the following:  Myocarditis or Pericarditis No  Dermal fillers:  No  Multisystem Inflammatory Syndrome (MIS-C or MIS-A)? No  COVID-19 disease within the past 3 months? No  Vaccinated with monkeypox vaccine in the last 4 weeks? No  Comirnaty 12+ 2023-24 formula and fluzone high dose administered; tolerated well.  VISs given and updated [facility]IR provided.  Stayed 15 minutes after vaccine given.  Tonny Branch, RN

## 2022-07-08 ENCOUNTER — Ambulatory Visit: Payer: Medicare HMO

## 2022-07-08 ENCOUNTER — Other Ambulatory Visit: Payer: Self-pay

## 2022-07-08 ENCOUNTER — Ambulatory Visit
Admission: RE | Admit: 2022-07-08 | Discharge: 2022-07-08 | Disposition: A | Discharge status: Home / Self Care | Payer: Medicare HMO | Source: Ambulatory Visit | Attending: Radiation Oncology | Admitting: Radiation Oncology

## 2022-07-08 DIAGNOSIS — Z510 Encounter for antineoplastic radiation therapy: Secondary | ICD-10-CM | POA: Diagnosis not present

## 2022-07-08 LAB — RAD ONC ARIA SESSION SUMMARY
Course Elapsed Days: 28
Plan Fractions Treated to Date: 21
Plan Prescribed Dose Per Fraction: 2.5 Gy
Plan Total Fractions Prescribed: 28
Plan Total Prescribed Dose: 70 Gy
Reference Point Dosage Given to Date: 52.5 Gy
Reference Point Session Dosage Given: 2.5 Gy
Session Number: 21

## 2022-07-09 ENCOUNTER — Other Ambulatory Visit: Payer: Self-pay

## 2022-07-09 ENCOUNTER — Ambulatory Visit
Admission: RE | Admit: 2022-07-09 | Discharge: 2022-07-09 | Disposition: A | Discharge status: Home / Self Care | Payer: Medicare HMO | Source: Ambulatory Visit | Attending: Radiation Oncology | Admitting: Radiation Oncology

## 2022-07-09 ENCOUNTER — Ambulatory Visit: Payer: Medicare HMO

## 2022-07-09 DIAGNOSIS — Z510 Encounter for antineoplastic radiation therapy: Secondary | ICD-10-CM | POA: Diagnosis not present

## 2022-07-09 LAB — RAD ONC ARIA SESSION SUMMARY
Course Elapsed Days: 29
Plan Fractions Treated to Date: 22
Plan Prescribed Dose Per Fraction: 2.5 Gy
Plan Total Fractions Prescribed: 28
Plan Total Prescribed Dose: 70 Gy
Reference Point Dosage Given to Date: 55 Gy
Reference Point Session Dosage Given: 2.5 Gy
Session Number: 22

## 2022-07-10 ENCOUNTER — Ambulatory Visit: Payer: Medicare HMO

## 2022-07-10 ENCOUNTER — Other Ambulatory Visit: Payer: Self-pay

## 2022-07-10 ENCOUNTER — Ambulatory Visit
Admission: RE | Admit: 2022-07-10 | Discharge: 2022-07-10 | Disposition: A | Discharge status: Home / Self Care | Payer: Medicare HMO | Source: Ambulatory Visit | Attending: Radiation Oncology | Admitting: Radiation Oncology

## 2022-07-10 DIAGNOSIS — Z510 Encounter for antineoplastic radiation therapy: Secondary | ICD-10-CM | POA: Diagnosis not present

## 2022-07-10 LAB — RAD ONC ARIA SESSION SUMMARY
Course Elapsed Days: 30
Plan Fractions Treated to Date: 23
Plan Prescribed Dose Per Fraction: 2.5 Gy
Plan Total Fractions Prescribed: 28
Plan Total Prescribed Dose: 70 Gy
Reference Point Dosage Given to Date: 57.5 Gy
Reference Point Session Dosage Given: 2.5 Gy
Session Number: 23

## 2022-07-13 ENCOUNTER — Other Ambulatory Visit: Payer: Self-pay

## 2022-07-13 ENCOUNTER — Ambulatory Visit
Admission: RE | Admit: 2022-07-13 | Discharge: 2022-07-13 | Disposition: A | Discharge status: Home / Self Care | Payer: Medicare HMO | Source: Ambulatory Visit | Attending: Radiation Oncology | Admitting: Radiation Oncology

## 2022-07-13 ENCOUNTER — Ambulatory Visit: Payer: Medicare HMO

## 2022-07-13 DIAGNOSIS — Z510 Encounter for antineoplastic radiation therapy: Secondary | ICD-10-CM | POA: Diagnosis not present

## 2022-07-13 LAB — RAD ONC ARIA SESSION SUMMARY
Course Elapsed Days: 33
Plan Fractions Treated to Date: 24
Plan Prescribed Dose Per Fraction: 2.5 Gy
Plan Total Fractions Prescribed: 28
Plan Total Prescribed Dose: 70 Gy
Reference Point Dosage Given to Date: 60 Gy
Reference Point Session Dosage Given: 2.5 Gy
Session Number: 24

## 2022-07-14 ENCOUNTER — Ambulatory Visit
Admission: RE | Admit: 2022-07-14 | Discharge: 2022-07-14 | Disposition: A | Discharge status: Home / Self Care | Payer: Medicare HMO | Source: Ambulatory Visit | Attending: Radiation Oncology | Admitting: Radiation Oncology

## 2022-07-14 ENCOUNTER — Ambulatory Visit: Payer: Medicare HMO

## 2022-07-14 ENCOUNTER — Other Ambulatory Visit: Payer: Self-pay

## 2022-07-14 DIAGNOSIS — Z510 Encounter for antineoplastic radiation therapy: Secondary | ICD-10-CM | POA: Diagnosis not present

## 2022-07-14 LAB — RAD ONC ARIA SESSION SUMMARY
Course Elapsed Days: 34
Plan Fractions Treated to Date: 25
Plan Prescribed Dose Per Fraction: 2.5 Gy
Plan Total Fractions Prescribed: 28
Plan Total Prescribed Dose: 70 Gy
Reference Point Dosage Given to Date: 62.5 Gy
Reference Point Session Dosage Given: 2.5 Gy
Session Number: 25

## 2022-07-15 ENCOUNTER — Ambulatory Visit: Payer: Medicare HMO

## 2022-07-15 ENCOUNTER — Ambulatory Visit
Admission: RE | Admit: 2022-07-15 | Discharge: 2022-07-15 | Disposition: A | Discharge status: Home / Self Care | Payer: Medicare HMO | Source: Ambulatory Visit | Attending: Radiation Oncology | Admitting: Radiation Oncology

## 2022-07-15 ENCOUNTER — Other Ambulatory Visit: Payer: Self-pay

## 2022-07-15 DIAGNOSIS — Z510 Encounter for antineoplastic radiation therapy: Secondary | ICD-10-CM | POA: Diagnosis not present

## 2022-07-15 LAB — RAD ONC ARIA SESSION SUMMARY
Course Elapsed Days: 35
Plan Fractions Treated to Date: 26
Plan Prescribed Dose Per Fraction: 2.5 Gy
Plan Total Fractions Prescribed: 28
Plan Total Prescribed Dose: 70 Gy
Reference Point Dosage Given to Date: 65 Gy
Reference Point Session Dosage Given: 2.5 Gy
Session Number: 26

## 2022-07-16 ENCOUNTER — Ambulatory Visit: Payer: Medicare HMO

## 2022-07-16 ENCOUNTER — Ambulatory Visit
Admission: RE | Admit: 2022-07-16 | Discharge: 2022-07-16 | Disposition: A | Discharge status: Home / Self Care | Payer: Medicare HMO | Source: Ambulatory Visit | Attending: Radiation Oncology | Admitting: Radiation Oncology

## 2022-07-16 ENCOUNTER — Other Ambulatory Visit: Payer: Self-pay

## 2022-07-16 DIAGNOSIS — Z510 Encounter for antineoplastic radiation therapy: Secondary | ICD-10-CM | POA: Diagnosis not present

## 2022-07-16 LAB — RAD ONC ARIA SESSION SUMMARY
Course Elapsed Days: 36
Plan Fractions Treated to Date: 27
Plan Prescribed Dose Per Fraction: 2.5 Gy
Plan Total Fractions Prescribed: 28
Plan Total Prescribed Dose: 70 Gy
Reference Point Dosage Given to Date: 67.5 Gy
Reference Point Session Dosage Given: 2.5 Gy
Session Number: 27

## 2022-07-17 ENCOUNTER — Ambulatory Visit
Admission: RE | Admit: 2022-07-17 | Discharge: 2022-07-17 | Disposition: A | Discharge status: Home / Self Care | Payer: Medicare HMO | Source: Ambulatory Visit | Attending: Radiation Oncology | Admitting: Radiation Oncology

## 2022-07-17 ENCOUNTER — Ambulatory Visit: Payer: Medicare HMO

## 2022-07-17 ENCOUNTER — Other Ambulatory Visit: Payer: Self-pay

## 2022-07-17 DIAGNOSIS — Z510 Encounter for antineoplastic radiation therapy: Secondary | ICD-10-CM | POA: Diagnosis not present

## 2022-07-17 LAB — RAD ONC ARIA SESSION SUMMARY
Course Elapsed Days: 37
Plan Fractions Treated to Date: 28
Plan Prescribed Dose Per Fraction: 2.5 Gy
Plan Total Fractions Prescribed: 28
Plan Total Prescribed Dose: 70 Gy
Reference Point Dosage Given to Date: 70 Gy
Reference Point Session Dosage Given: 2.5 Gy
Session Number: 28

## 2022-07-20 ENCOUNTER — Ambulatory Visit: Payer: Medicare HMO

## 2022-07-20 ENCOUNTER — Inpatient Hospital Stay: Payer: Medicare HMO

## 2022-07-21 ENCOUNTER — Ambulatory Visit: Payer: Medicare HMO

## 2022-07-22 ENCOUNTER — Ambulatory Visit: Payer: Medicare HMO

## 2022-07-27 ENCOUNTER — Ambulatory Visit: Payer: Medicare HMO

## 2022-07-28 ENCOUNTER — Ambulatory Visit: Payer: Medicare HMO

## 2022-07-29 ENCOUNTER — Ambulatory Visit: Payer: Medicare HMO

## 2022-07-30 ENCOUNTER — Ambulatory Visit: Payer: Medicare HMO

## 2022-07-31 ENCOUNTER — Ambulatory Visit: Payer: Medicare HMO

## 2022-08-03 ENCOUNTER — Ambulatory Visit: Payer: Medicare HMO

## 2022-08-03 ENCOUNTER — Inpatient Hospital Stay: Payer: Medicare HMO

## 2022-08-04 ENCOUNTER — Ambulatory Visit: Payer: Medicare HMO

## 2022-08-05 ENCOUNTER — Ambulatory Visit: Payer: Medicare HMO

## 2022-08-17 ENCOUNTER — Ambulatory Visit
Admission: RE | Admit: 2022-08-17 | Discharge: 2022-08-17 | Disposition: A | Discharge status: Home / Self Care | Payer: Medicare HMO | Source: Ambulatory Visit | Attending: Radiation Oncology | Admitting: Radiation Oncology

## 2022-08-17 VITALS — BP 134/68 | HR 60 | Temp 97.0°F | Resp 14 | Ht 70.0 in | Wt 168.1 lb

## 2022-08-17 DIAGNOSIS — C61 Malignant neoplasm of prostate: Secondary | ICD-10-CM

## 2022-08-17 NOTE — Progress Notes (Signed)
Radiation Oncology Follow up Note  Name: Corey Jimenez   Date:   08/17/2022 MRN:  [id number] DOB: 1945/11/13    This 76 y.o. male presents to the clinic today for 1 month follow-up status post external beam IMRT radiation therapy to his prostate  for stage IIc Gleason 8 (4+4) adenocarcinoma presenting with a PSA of 7.3.  REFERRING PROVIDER: Albina Billet, MD  HPI: Patient is a 76 year old male now out 1 month having completed IMRT radiation therapy to his prostate for Gleason 8 adenocarcinoma seen today in routine follow-up is still having some slight urgency and frequency.  Does drink cranberry juice on a regular basis he is also seeing some bright red blood per rectum does take a baby aspirin every day.  That seems to be improving also.  He continues to have hot flashes which I have recommended vitamin EE 4..  COMPLICATIONS OF TREATMENT: none  FOLLOW UP COMPLIANCE: keeps appointments   PHYSICAL EXAM:  BP 134/68   Pulse 60   Temp (!) 97 F (36.1 C)   Resp 14   Ht '5\' 10"'$  (1.778 m)   Wt 168 lb 1.6 oz (76.2 kg)   BMI 24.12 kg/m  Well-developed well-nourished patient in NAD. HEENT reveals PERLA, EOMI, discs not visualized.  Oral cavity is clear. No oral mucosal lesions are identified. Neck is clear without evidence of cervical or supraclavicular adenopathy. Lungs are clear to A&P. Cardiac examination is essentially unremarkable with regular rate and rhythm without murmur rub or thrill. Abdomen is benign with no organomegaly or masses noted. Motor sensory and DTR levels are equal and symmetric in the upper and lower extremities. Cranial nerves II through XII are grossly intact. Proprioception is intact. No peripheral adenopathy or edema is identified. No motor or sensory levels are noted. Crude visual fields are within normal range.  RADIOLOGY RESULTS: No current films for review  PLAN: Present time patient is improving with his overall side effect profile.  I have asked him to continue  using cranberry juice.  He will also continue using Metamucil for his constipation.  I have asked to see him back in 3 months with a PSA at that time.  He is asked to transfer his records to another urologist which we are help facilitating since Dr. Eliberto Ivory is retiring.  Patient is to call with any concerns.  I would like to take this opportunity to thank you for allowing me to participate in the care of your patient.Noreene Filbert, MD

## 2022-08-27 ENCOUNTER — Ambulatory Visit (LOCAL_COMMUNITY_HEALTH_CENTER): Payer: Medicare Other

## 2022-08-27 ENCOUNTER — Ambulatory Visit: Payer: Medicare HMO

## 2022-08-27 ENCOUNTER — Ambulatory Visit: Payer: Medicare Other

## 2022-08-27 DIAGNOSIS — Z23 Encounter for immunization: Secondary | ICD-10-CM | POA: Diagnosis not present

## 2022-08-27 DIAGNOSIS — Z719 Counseling, unspecified: Secondary | ICD-10-CM

## 2022-08-27 NOTE — Progress Notes (Signed)
RSV IM in left deltoid and Tdap IM right deltoid.  Tolerated well. VIS provided.  [facility]IR updated and copy provided.

## 2022-11-19 ENCOUNTER — Other Ambulatory Visit: Payer: Self-pay | Admitting: *Deleted

## 2022-11-19 DIAGNOSIS — C61 Malignant neoplasm of prostate: Secondary | ICD-10-CM

## 2022-11-25 ENCOUNTER — Inpatient Hospital Stay: Payer: Medicare Other

## 2022-11-26 ENCOUNTER — Inpatient Hospital Stay: Payer: Medicare HMO | Attending: Radiation Oncology

## 2022-11-26 DIAGNOSIS — Z923 Personal history of irradiation: Secondary | ICD-10-CM | POA: Insufficient documentation

## 2022-11-26 DIAGNOSIS — C61 Malignant neoplasm of prostate: Secondary | ICD-10-CM | POA: Insufficient documentation

## 2022-11-26 LAB — CBC
HCT: 37.1 % — ABNORMAL LOW (ref 39.0–52.0)
Hemoglobin: 12.3 g/dL — ABNORMAL LOW (ref 13.0–17.0)
MCH: 30.3 pg (ref 26.0–34.0)
MCHC: 33.2 g/dL (ref 30.0–36.0)
MCV: 91.4 fL (ref 80.0–100.0)
Platelets: 221 10*3/uL (ref 150–400)
RBC: 4.06 MIL/uL — ABNORMAL LOW (ref 4.22–5.81)
RDW: 13 % (ref 11.5–15.5)
WBC: 3.7 10*3/uL — ABNORMAL LOW (ref 4.0–10.5)
nRBC: 0 % (ref 0.0–0.2)

## 2022-11-26 LAB — PSA: Prostatic Specific Antigen: <0.01 ng/mL (ref 0.00–4.00)

## 2022-12-02 ENCOUNTER — Encounter: Payer: Self-pay | Admitting: Radiation Oncology

## 2022-12-02 ENCOUNTER — Other Ambulatory Visit: Payer: Self-pay | Admitting: *Deleted

## 2022-12-02 ENCOUNTER — Ambulatory Visit
Admission: RE | Admit: 2022-12-02 | Discharge: 2022-12-02 | Disposition: A | Discharge status: Home / Self Care | Payer: Medicare HMO | Source: Ambulatory Visit | Attending: Radiation Oncology | Admitting: Radiation Oncology

## 2022-12-02 VITALS — BP 121/73 | HR 58 | Temp 98.5°F | Resp 16 | Wt 167.0 lb

## 2022-12-02 DIAGNOSIS — K5900 Constipation, unspecified: Secondary | ICD-10-CM | POA: Diagnosis not present

## 2022-12-02 DIAGNOSIS — Z923 Personal history of irradiation: Secondary | ICD-10-CM | POA: Insufficient documentation

## 2022-12-02 DIAGNOSIS — C61 Malignant neoplasm of prostate: Secondary | ICD-10-CM | POA: Diagnosis present

## 2022-12-02 NOTE — Progress Notes (Signed)
Radiation Oncology Follow up Note  Name: Corey Jimenez   Date:   12/02/2022 MRN:  [id number] DOB: 09-20-1945    This 77 y.o. male presents to the clinic today for 29-month follow-up.  Status post IMRT radiation therapy to his prostate for stage IIc Gleason 8 (4+4) adenocarcinoma presenting with a PSA of 7.3  REFERRING PROVIDER: Albina Billet, MD  HPI: Patient is a 77 year old male now out for months having completed IMRT radiation therapy to his prostate for Gleason 8 adenocarcinoma seen today in routine follow-up he is doing well he specifically denies any increased lower urinary tract symptoms.  He does stay constipated does take Metamucil every day I have suggested starting some activity.  His most recent PSA is less than 0.01.  He was a former patient of Dr. Redmond Pulling and we are transferring his care to Dr. Cherrie Gauze group..  COMPLICATIONS OF TREATMENT: none  FOLLOW UP COMPLIANCE: keeps appointments   PHYSICAL EXAM:  BP 121/73   Pulse (!) 58   Temp 98.5 F (36.9 C) (Tympanic)   Resp 16   Wt 167 lb (75.8 kg)   BMI 23.96 kg/m  Well-developed well-nourished patient in NAD. HEENT reveals PERLA, EOMI, discs not visualized.  Oral cavity is clear. No oral mucosal lesions are identified. Neck is clear without evidence of cervical or supraclavicular adenopathy. Lungs are clear to A&P. Cardiac examination is essentially unremarkable with regular rate and rhythm without murmur rub or thrill. Abdomen is benign with no organomegaly or masses noted. Motor sensory and DTR levels are equal and symmetric in the upper and lower extremities. Cranial nerves II through XII are grossly intact. Proprioception is intact. No peripheral adenopathy or edema is identified. No motor or sensory levels are noted. Crude visual fields are within normal range.  RADIOLOGY RESULTS: No current films for review  PLAN: I had a discussion today with the patient and have explained that most oncologist would recommend probably 2 to  3 years of Eligard treatment for the Gleason 8 score.  I am referring him to Dr. Cherrie Gauze group for Continuity of his urologic care.  They will have that discussion about Eligard with him.  I will see him back in 6 months with a repeat PSA.  Patient is to call with any concerns.  I would like to take this opportunity to thank you for allowing me to participate in the care of your patient.Noreene Filbert, MD

## 2022-12-03 ENCOUNTER — Other Ambulatory Visit: Payer: Self-pay | Admitting: *Deleted

## 2022-12-03 DIAGNOSIS — C61 Malignant neoplasm of prostate: Secondary | ICD-10-CM

## 2023-01-06 ENCOUNTER — Ambulatory Visit: Payer: Medicare HMO | Admitting: Urology

## 2023-01-06 VITALS — BP 116/67 | HR 56 | Ht 70.0 in | Wt 170.0 lb

## 2023-01-06 DIAGNOSIS — E7800 Pure hypercholesterolemia, unspecified: Secondary | ICD-10-CM | POA: Insufficient documentation

## 2023-01-06 DIAGNOSIS — C61 Malignant neoplasm of prostate: Secondary | ICD-10-CM | POA: Diagnosis not present

## 2023-01-06 DIAGNOSIS — I214 Non-ST elevation (NSTEMI) myocardial infarction: Secondary | ICD-10-CM | POA: Insufficient documentation

## 2023-01-06 DIAGNOSIS — I2510 Atherosclerotic heart disease of native coronary artery without angina pectoris: Secondary | ICD-10-CM | POA: Insufficient documentation

## 2023-01-06 MED ORDER — ORGOVYX 120 MG PO TABS
ORAL_TABLET | ORAL | 11 refills | Status: DC
Start: 2023-01-06 — End: 2023-12-20

## 2023-01-06 NOTE — Progress Notes (Signed)
Marcelle Overlie Plume,acting as a scribe for Vanna Scotland, MD.,have documented all relevant documentation on the behalf of Vanna Scotland, MD,as directed by  Vanna Scotland, MD while in the presence of Vanna Scotland, MD.  01/06/2023 10:05 AM   Corey Jimenez 11-Dec-1945 [id number]  Referring provider: Jaclyn Shaggy, MD [address]   [place],  [place] [address]  Chief Complaint  Patient presents with   Establish Care   Prostate Cancer    HPI: 77 year-old male who presents today for further evaluation of prostate cancer. He was previously followed by Dr. Evelene Croon as well as under the care of Dr. Rushie Chestnut having recently completed IMRT. He has a personal history of T1c, Gleason 4+4 prostate cancer with a baseline PSA of 7.3 at the time of diagnosis. (Actual PSA 3.7, but doubled secondary to Dutasteride effect). He elected for IMRT for treatment of his high risk prostate cancer. He did receive a 45 mg dose of Eligard on 05/28/2022. His prostate volume is 21.1 cc's. A prostate MRI on 09/18/2021 showed a PI-RADS 4 lesion in the right mid-gland and a nodular transition zone. Otherwise, no evidence of extracapsular extension or lymphadenopathy. He did have any further staging. A fusion biopsy was performed on 04/16/2022.   His most recent PSA on 11/26/2022 was undetectable.  Today, he reports urgency and frequency, symptoms present even prior to completing radiation in fall 2003. He is on no additional medications.  He has a history of a heart attack, which delayed his prostate cancer treatment, and a stent placement approximately six months prior to his biopsy.       PMH: Past Medical History:  Diagnosis Date   Asthma 08/31/1993   BPH (benign prostatic hyperplasia)    CAD (coronary artery disease) 11/03/2021   a.) NSTEMI 11/03/2021 --> LHC --> 30-40% mLAD, 90-95% LCx prior to the bifurcation of the AV groove circumflex and a large posterior lateral branch, minor luminal irregs (not > 20-30%)  throughout all RCA distributions; PCI performed placing a 3.5 x 22 mm Medtronic Frontier DES x 1 to LCx   Chronic bronchitis (HCC)    Complication of anesthesia    had event during colonoscopy needed suction which caused bruising to uvula   Elevated PSA    a.) PSA elevated at 7.3; b.) Prostate MRI 09/18/2021 --> volume 44 cm, posterior RIGHT mind gland lesion 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   Hemorrhoids    a.) s/p hemorrhoidectomy 2012   HLD (hyperlipidemia)    HTN (hypertension)    Irritable bowel disease    Long term current use of antithrombotics/antiplatelets    a.) on daily DAPT therapy (ASA + ticagrelor)   Mild hearing loss    NSTEMI (non-ST elevated myocardial infarction) (HCC) 11/03/2021   a.) TTE 11/03/2021: EF 55-60%, mild RVE, triv TR; b.) LHC -->  90-95% LCx prior to the bifurcation of the AV groove circumflex and a large posterior lateral branch --> PCI performed placing a 3.5 x 22 mm Medtronic Frontier DES x 1   Osteoarthritis    Perforation of sigmoid colon (HCC) 2014   Personal history of colonic polyps     Surgical History: Past Surgical History:  Procedure Laterality Date   BLEPHAROPLASTY     COLONOSCOPY  2008, 2011,2014   Dr Lemar Livings   CORONARY ANGIOPLASTY WITH STENT PLACEMENT Left 11/03/2021   Procedure: CORONARY ANGIOPLASTY WITH STENT PLACEMENT; Location: [facility]; Surgeon: Gilmer Mor, MD   HEMORRHOID SURGERY  N/A 2012   PROSTATE BIOPSY N/A 04/16/2022   Procedure: PROSTATE BIOPSY  URO NAV;  Surgeon: Orson Ape, MD;  Location: [facility];  Service: Urology;  Laterality: N/A;   TONSILLECTOMY Bilateral 1951    Home Medications:  Allergies as of 01/06/2023       Reactions   No Allergies On File         Medication List        Accurate as of Jan 06, 2023 10:05 AM. If you have any questions, ask your nurse or doctor.          STOP taking these medications    acetaminophen 500 MG tablet Commonly known as:  TYLENOL   mupirocin ointment 2 % Commonly known as: BACTROBAN       TAKE these medications    albuterol 108 (90 Base) MCG/ACT inhaler Commonly known as: VENTOLIN HFA Inhale 2 puffs into the lungs every 6 (six) hours as needed for wheezing or shortness of breath.   aspirin EC 81 MG tablet Take 81 mg by mouth daily. Swallow whole.   atorvastatin 80 MG tablet Commonly known as: LIPITOR Take 80 mg by mouth daily.   ezetimibe 10 MG tablet Commonly known as: ZETIA Take 10 mg by mouth at bedtime.   METAMUCIL PO Take 1 Dose by mouth daily. 1 dose = 4 teaspoons   metoprolol tartrate 25 MG tablet Commonly known as: LOPRESSOR Take 12.5 mg by mouth 2 (two) times daily.   multivitamin with minerals Tabs tablet Take 1 tablet by mouth daily.   Orgovyx 120 MG tablet Generic drug: relugolix Take 3 tablets one time and then take 1 tablet once daily thereafter   ticagrelor 90 MG Tabs tablet Commonly known as: BRILINTA Take 90 mg by mouth 2 (two) times daily.   Vitamin D (Ergocalciferol) 1.25 MG (50000 UNIT) Caps capsule Commonly known as: DRISDOL Take 50,000 Units by mouth every Saturday.        Allergies:  Allergies  Allergen Reactions   No Allergies On File     Family History: Family History  Problem Relation Age of Onset   Parkinson's disease Father     Social History:  reports that he quit smoking about 42 years ago. His smoking use included cigarettes. He has quit using smokeless tobacco.  His smokeless tobacco use included chew. He reports current alcohol use. He reports that he does not use drugs.   Physical Exam: BP 116/67   Pulse (!) 56   Ht 5\' 10"  (1.778 m)   Wt 170 lb (77.1 kg)   BMI 24.39 kg/m   Constitutional:  Alert and oriented, No acute distress. HEENT: [place] AT, moist mucus membranes.  Trachea midline, no masses. Neurologic: Grossly intact, no focal deficits, moving all 4 extremities. Psychiatric: Normal mood and affect.  Pertinent  Imaging: EXAM: MR PROSTATE WITHOUT AND WITH CONTRAST   TECHNIQUE: Multiplanar multisequence MRI images were obtained of the pelvis centered about the prostate. Pre and post contrast images were obtained.   CONTRAST:  7.18mL GADAVIST GADOBUTROL 1 MMOL/ML IV SOLN   COMPARISON:  7.5 mL Gadavist   FINDINGS: Prostate: Subtle focus of mild restricted diffusion in the posterior RIGHT mid gland measuring approximately 9 mm (image 10/series 7). Potential correlative focus of low signal intensity within the peripheral zone T2 weighted imaging on image 45/series 9. Rocus of early enhancement through this same region image 16/series 23.   No additional foci of restricted diffusion peripheral zone.   Transitional zone  is enlarged by well capsulated nodules suspicious imaging characteristics   Volume: 4.9 x 3.6 x 4.8 cm (volume = 44 cm^3)   Transcapsular spread:  Absent   Seminal vesicle involvement: Absent   Neurovascular bundle involvement: Absent   Pelvic adenopathy: Absent   Bone metastasis: Absent   Other findings: None   IMPRESSION: 1. Suspicious lesion in the posterior RIGHT mid gland concerning for high-grade carcinoma. PI-RADS: 4. (Dynacad 3D post processing performed) ROI #1. 2. Enlarged nodular transitional zone most consistent with benign prostate hypertrophy. PI-RADS: 2     Electronically Signed   By: Genevive Bi M.D.   On: 09/18/2021 14:28  This was personally reviewed and I agree with the radiologic interpretation.   Assessment & Plan:    1. Prostate cancer - High-Risk, T1c, Gleason 4+4 - Continue Orgovyx for an additional year to complete a minimum of 18 months of ADT. We had a lengthy discussion about the side effects of the medication, the differences between oral Orgovyx and Eligard in terms of side effect profile, risks and benefits, as well as cost. - We discussed the data regarding additional ADT duration based on risk stratification. He's  agreeable to continue Orgovyx until at least March of 2025.  - We will continue to check a PSA every six months. We discussed bone health recommendations as well today, weight-bearing exercise, etc.  Return in about 6 months (around 07/09/2023) for repeat PSA.   [facility] [address] [place], [place] [address] [phone]

## 2023-01-06 NOTE — Patient Instructions (Signed)
Discussed importance of bone health on ADT, recommend 1000-1200 mg daily calcium suppliment and 800-1000 IU vit D daily.  Also encouraged weight being exercises and cardiovascular health.  

## 2023-01-24 IMAGING — CR DG CHEST 2V
3 series · 3 of 3 positions shown · non-contrast
Comparison: Chest x-ray 09/26/2018.

CLINICAL DATA: 75-year-old male with history of cough for the past
2 weeks. Wheezing.

EXAM:
CHEST - 2 VIEW

[chest pa (1 of 2)]
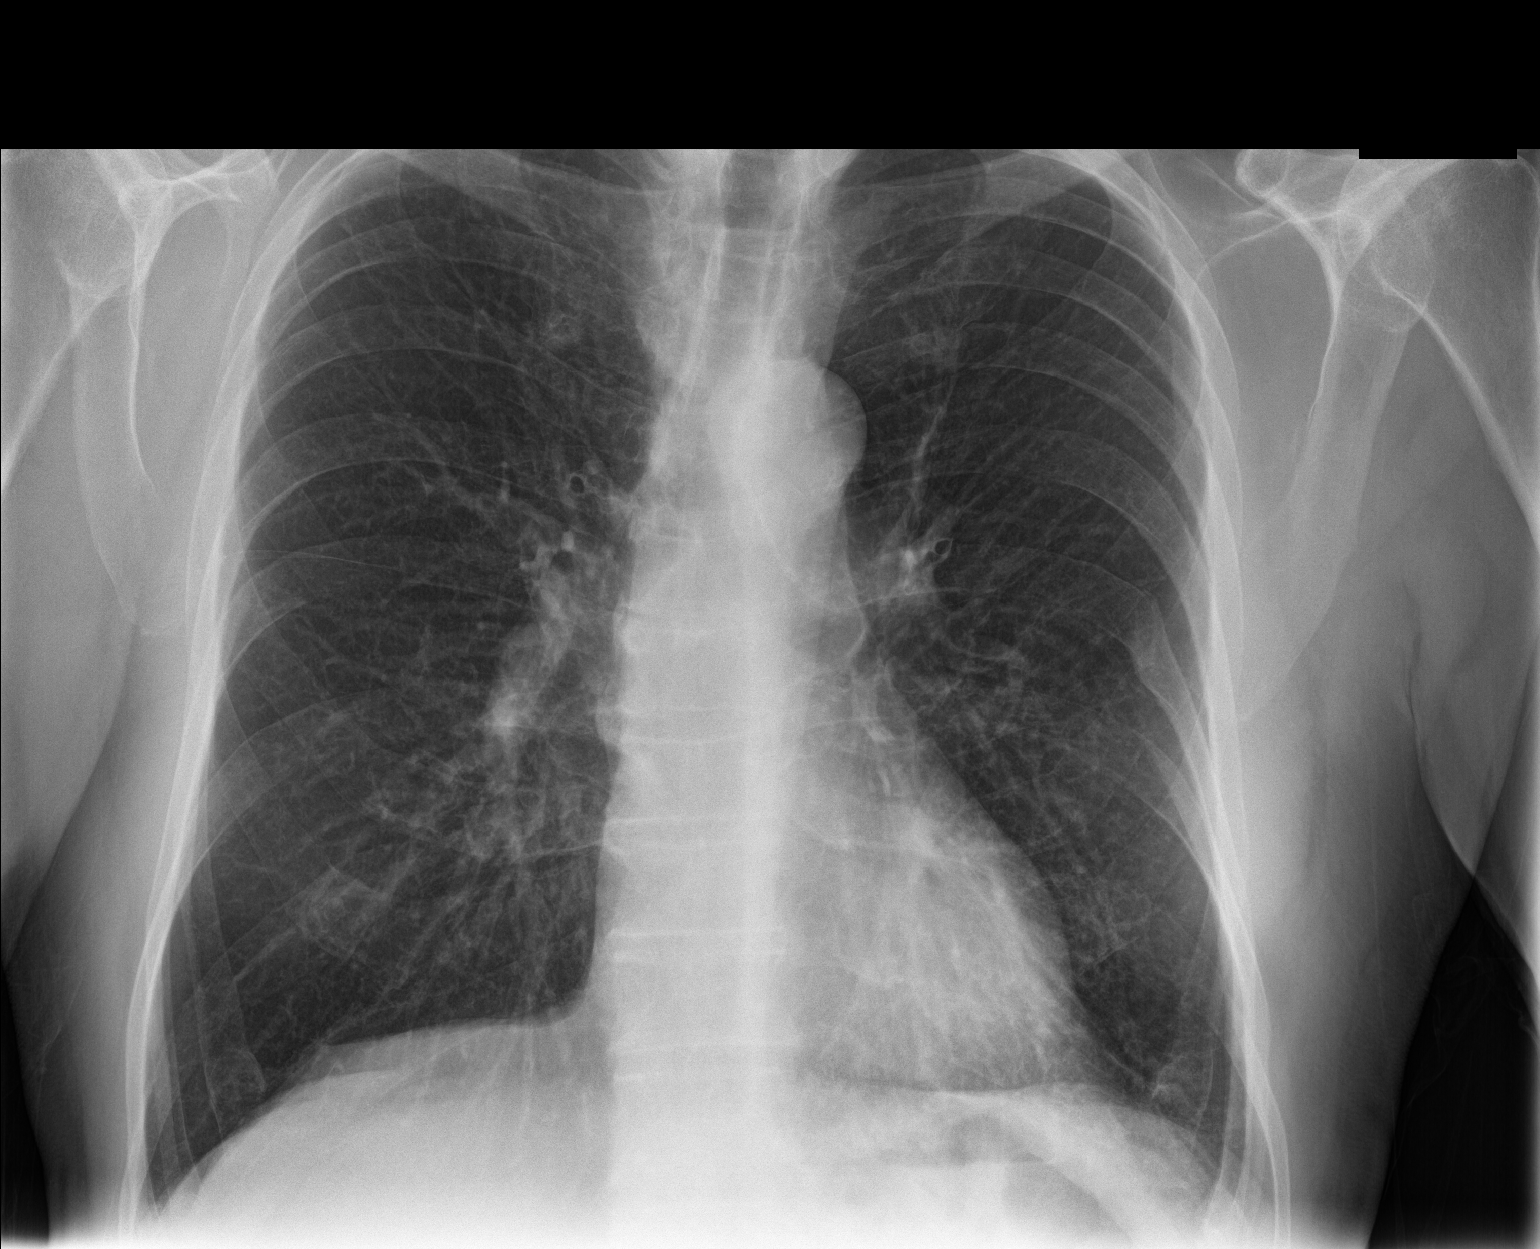

[chest lat]
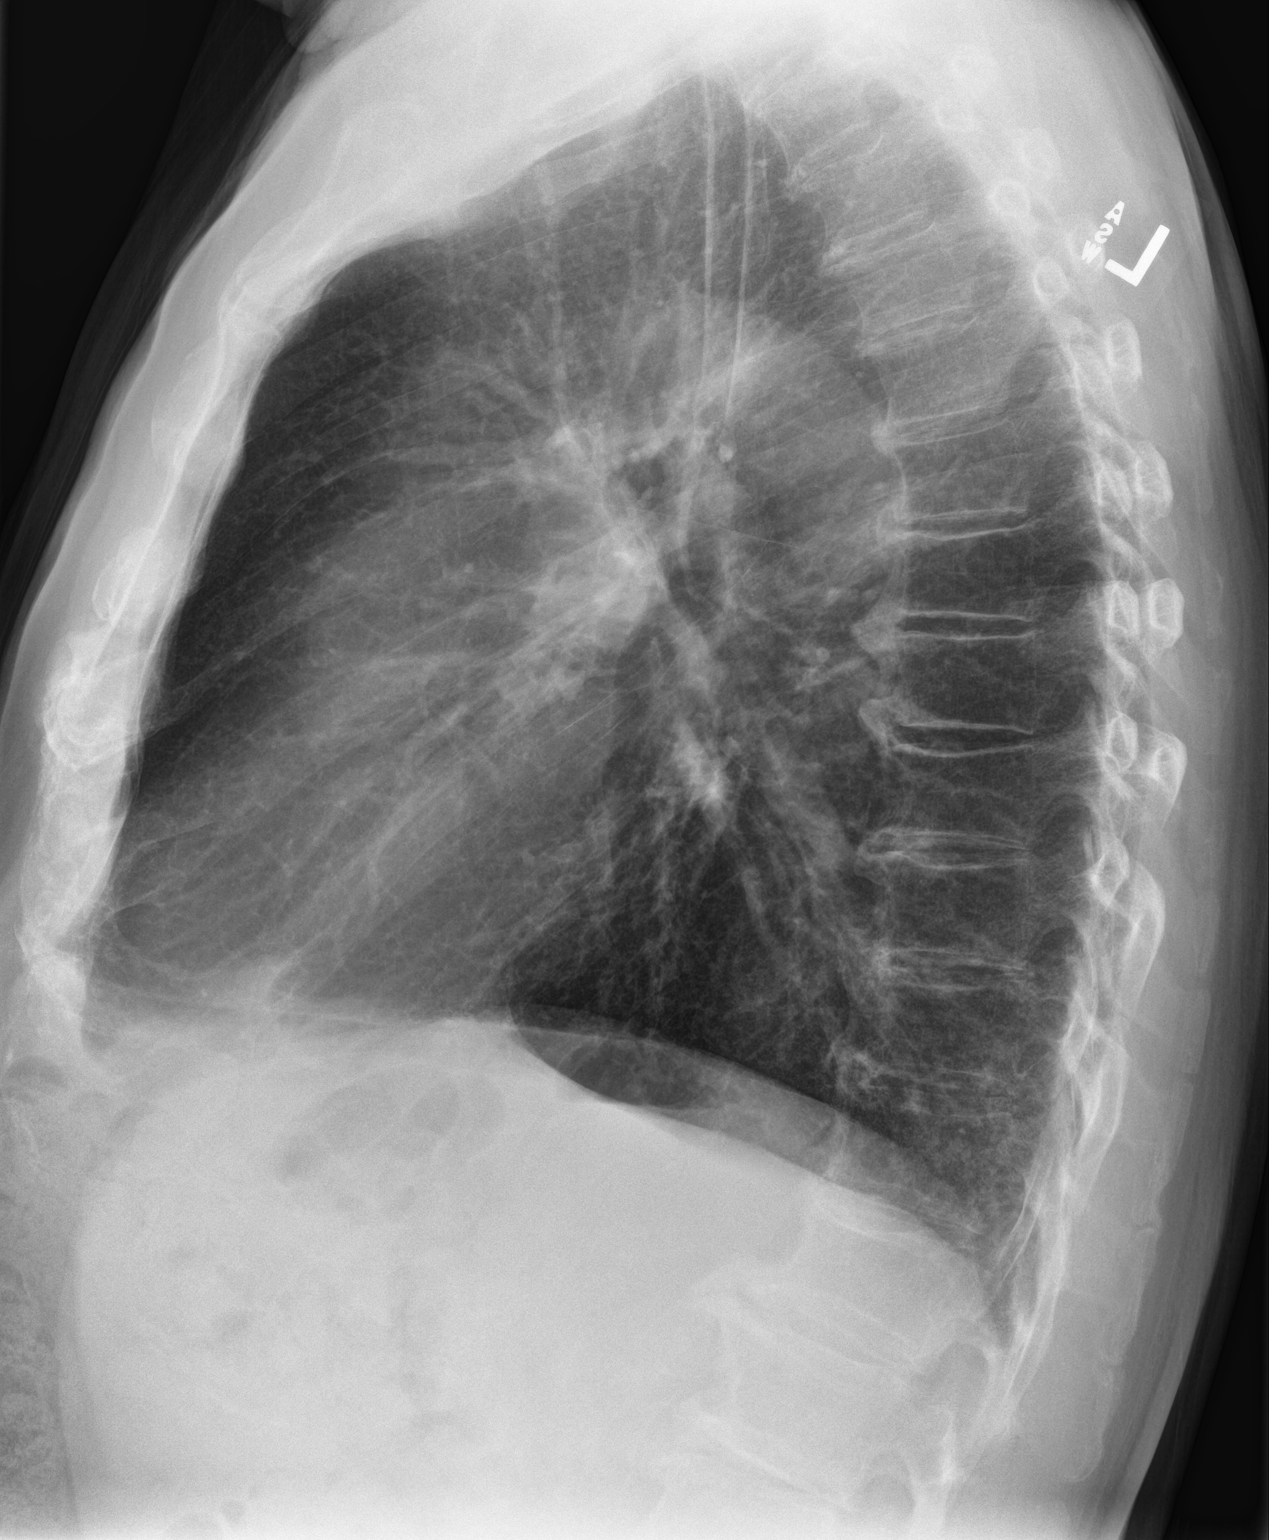

[chest pa (2 of 2)]
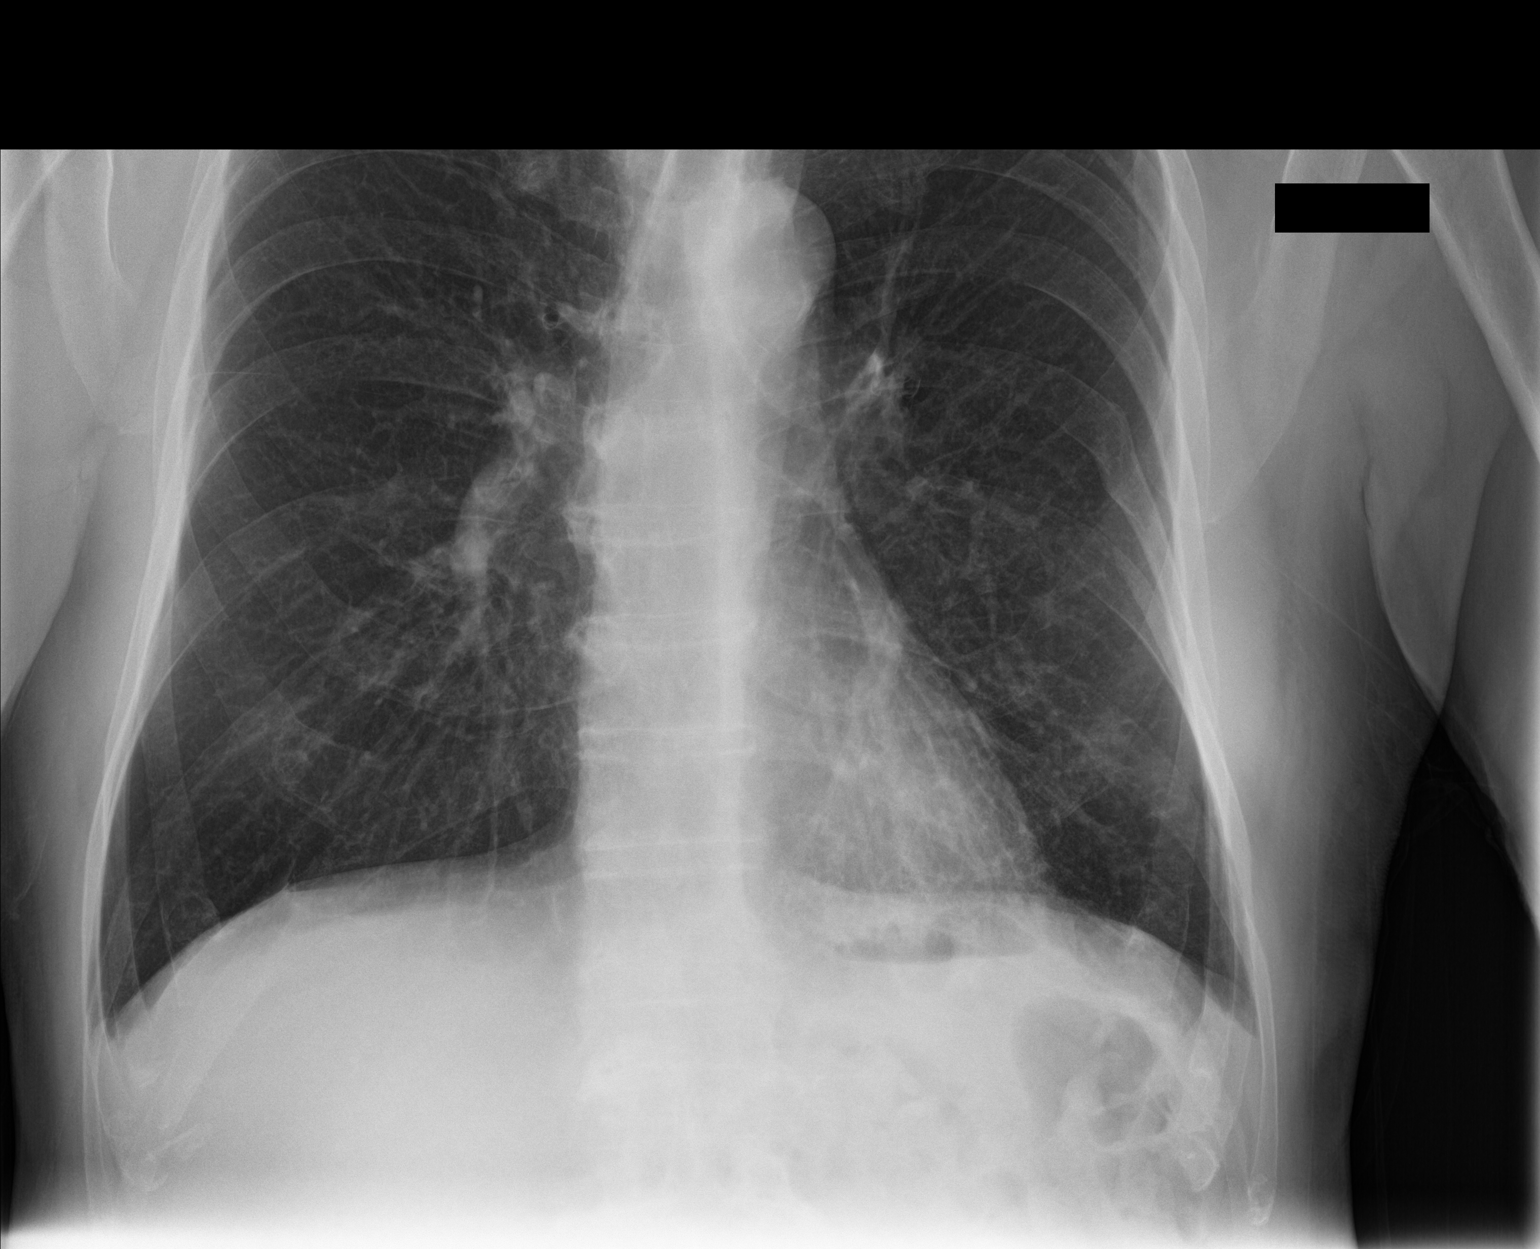

[3 of 3 positions shown; findings below may reference images not displayed]

FINDINGS: Lung volumes are normal. No consolidative airspace disease. No
pleural effusions. No pneumothorax. No pulmonary nodule or mass
noted. Pulmonary vasculature and the cardiomediastinal silhouette
are within normal limits. Multiple old healed left-sided rib
fractures.
IMPRESSION: No radiographic evidence of acute cardiopulmonary disease.

## 2023-01-29 ENCOUNTER — Ambulatory Visit: Payer: Self-pay | Admitting: Urology

## 2023-02-02 ENCOUNTER — Telehealth: Payer: Self-pay

## 2023-02-02 NOTE — Telephone Encounter (Signed)
Called left on triage line  Pt states he has a question about a medication he was rxed.   LMTRC.

## 2023-02-03 NOTE — Telephone Encounter (Signed)
Spoke with patient. Patient was contacted by [employer] in regards to starting Orgovyx. His co pay is going to be around $683. He was not sure if there was something else cheaper. Advised patient that I will call Orgov[employer] support program to see if he can get qualified for assistance program. Patient states he filled out some information for this online yesterday but if he needs to come by to sign another form to just let him know. I advised patient I would look into this and call him back once I have more information. Patient appreciated my call.

## 2023-02-03 NOTE — Telephone Encounter (Signed)
Spoke with [employer] support and was advised that they were able to take patient's electronic signature done online earlier today and that they just need the rest of the form from [place]. I will work on this now. I called patient to update him on this but there was no answer. I will call tomorrow

## 2023-02-04 NOTE — Telephone Encounter (Signed)
Patient updated.

## 2023-05-17 ENCOUNTER — Ambulatory Visit: Payer: Medicare HMO

## 2023-05-17 DIAGNOSIS — Z23 Encounter for immunization: Secondary | ICD-10-CM | POA: Diagnosis not present

## 2023-05-17 DIAGNOSIS — Z719 Counseling, unspecified: Secondary | ICD-10-CM

## 2023-05-17 NOTE — Progress Notes (Signed)
In nurse clinic for immunizations, requesting Covid and High-dose Flu. Voices no concerns. VIS reviewed and given to patient. Vaccines tolerated well; no issues noted. [facility] updated and copies given to patient.   Abagail Kitchens, RN

## 2023-05-31 ENCOUNTER — Other Ambulatory Visit: Payer: Medicare Other

## 2023-06-01 ENCOUNTER — Other Ambulatory Visit: Payer: Medicare HMO

## 2023-06-01 DIAGNOSIS — C61 Malignant neoplasm of prostate: Secondary | ICD-10-CM

## 2023-06-02 LAB — PSA: Prostate Specific Ag, Serum: <0.1 ng/mL (ref 0.0–4.0)

## 2023-06-03 ENCOUNTER — Ambulatory Visit: Payer: Medicare Other | Admitting: Radiation Oncology

## 2023-06-04 ENCOUNTER — Other Ambulatory Visit: Payer: Medicare HMO

## 2023-06-08 ENCOUNTER — Ambulatory Visit: Payer: Medicare HMO | Admitting: Urology

## 2023-06-08 ENCOUNTER — Ambulatory Visit
Admission: RE | Admit: 2023-06-08 | Discharge: 2023-06-08 | Disposition: A | Discharge status: Home / Self Care | Payer: Medicare HMO | Source: Ambulatory Visit | Attending: Radiation Oncology | Admitting: Radiation Oncology

## 2023-06-08 VITALS — BP 160/82 | HR 56 | Ht 70.0 in | Wt 171.2 lb

## 2023-06-08 VITALS — BP 145/76 | HR 52 | Temp 97.0°F | Resp 16 | Ht 70.0 in | Wt 171.3 lb

## 2023-06-08 DIAGNOSIS — C61 Malignant neoplasm of prostate: Secondary | ICD-10-CM | POA: Insufficient documentation

## 2023-06-08 DIAGNOSIS — Z923 Personal history of irradiation: Secondary | ICD-10-CM | POA: Diagnosis not present

## 2023-06-08 DIAGNOSIS — R351 Nocturia: Secondary | ICD-10-CM

## 2023-06-08 DIAGNOSIS — K921 Melena: Secondary | ICD-10-CM | POA: Diagnosis not present

## 2023-06-08 DIAGNOSIS — K625 Hemorrhage of anus and rectum: Secondary | ICD-10-CM | POA: Insufficient documentation

## 2023-06-08 NOTE — Progress Notes (Signed)
Marcelle Overlie Plume,acting as a scribe for Vanna Scotland, MD.,have documented all relevant documentation on the behalf of Vanna Scotland, MD,as directed by  Vanna Scotland, MD while in the presence of Vanna Scotland, MD.  06/08/2023 11:00 AM   Corey Jimenez 1946/04/19 [id number]  Referring provider: Jaclyn Shaggy, MD [address]   [place],  [place] [address]  Chief Complaint  Patient presents with   Prostate Cancer    HPI: 77 year-old male with a personal history of prostate cancer.   He was previously followed by Dr. Evelene Croon as well as under the care of Dr. Rushie Chestnut having recently completed IMRT. He has a personal history of T1c, Gleason 4+4 prostate cancer with a baseline PSA of 7.3 at the time of diagnosis. (Actual PSA 3.7, but doubled secondary to Dutasteride effect).    A prostate MRI on 09/18/2021 showed a PI-RADS 4 lesion in the right mid-gland and a nodular transition zone. Otherwise, no evidence of extracapsular extension or lymphadenopathy. He did have any further staging. A fusion biopsy was performed on 04/16/2022.    He elected for IMRT for treatment of his high risk prostate cancer. He did receive a 45 mg dose of Eligard on 05/28/2022. His prostate volume is 21.1 cc's.   He has since transitioned from Eligard to [place] and is planning to continue ADT through 10/2023.   His most recent PSA on 06/01/2023 remains undetectable.  Today, he reports experiencing nocturia, getting up once to four times per night, and reports urgency, particularly when arriving home.   PMH: Past Medical History:  Diagnosis Date   Asthma 08/31/1993   BPH (benign prostatic hyperplasia)    CAD (coronary artery disease) 11/03/2021   a.) NSTEMI 11/03/2021 --> LHC --> 30-40% mLAD, 90-95% LCx prior to the bifurcation of the AV groove circumflex and a large posterior lateral branch, minor luminal irregs (not > 20-30%) throughout all RCA distributions; PCI performed placing a 3.5 x 22 mm Medtronic  Frontier DES x 1 to LCx   Chronic bronchitis (HCC)    Complication of anesthesia    had event during colonoscopy needed suction which caused bruising to uvula   Elevated PSA    a.) PSA elevated at 7.3; b.) Prostate MRI 09/18/2021 --> volume 44 cm, posterior RIGHT mind gland lesion 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   Hemorrhoids    a.) s/p hemorrhoidectomy 2012   HLD (hyperlipidemia)    HTN (hypertension)    Irritable bowel disease    Long term current use of antithrombotics/antiplatelets    a.) on daily DAPT therapy (ASA + ticagrelor)   Mild hearing loss    NSTEMI (non-ST elevated myocardial infarction) (HCC) 11/03/2021   a.) TTE 11/03/2021: EF 55-60%, mild RVE, triv TR; b.) LHC -->  90-95% LCx prior to the bifurcation of the AV groove circumflex and a large posterior lateral branch --> PCI performed placing a 3.5 x 22 mm Medtronic Frontier DES x 1   Osteoarthritis    Perforation of sigmoid colon (HCC) 2014   Personal history of colonic polyps     Surgical History: Past Surgical History:  Procedure Laterality Date   BLEPHAROPLASTY     COLONOSCOPY  2008, 2011,2014   Dr Lemar Livings   CORONARY ANGIOPLASTY WITH STENT PLACEMENT Left 11/03/2021   Procedure: CORONARY ANGIOPLASTY WITH STENT PLACEMENT; Location: [facility]; Surgeon: Gilmer Mor, MD   HEMORRHOID SURGERY N/A 2012   PROSTATE BIOPSY N/A 04/16/2022   Procedure: PROSTATE  BIOPSY  URO NAV;  Surgeon: Orson Ape, MD;  Location: [facility];  Service: Urology;  Laterality: N/A;   TONSILLECTOMY Bilateral 1951    Home Medications:  Allergies as of 06/08/2023       Reactions   No Allergies On File         Medication List        Accurate as of June 08, 2023 11:00 AM. If you have any questions, ask your nurse or doctor.          STOP taking these medications    ticagrelor 90 MG Tabs tablet Commonly known as: BRILINTA       TAKE these medications    albuterol 108 (90 Base) MCG/ACT  inhaler Commonly known as: VENTOLIN HFA Inhale 2 puffs into the lungs every 6 (six) hours as needed for wheezing or shortness of breath.   aspirin EC 81 MG tablet Take 81 mg by mouth daily. Swallow whole.   atorvastatin 80 MG tablet Commonly known as: LIPITOR Take 80 mg by mouth daily.   doxycycline 100 MG capsule Commonly known as: VIBRAMYCIN Take 100 mg by mouth 2 (two) times daily.   ezetimibe 10 MG tablet Commonly known as: ZETIA Take 10 mg by mouth at bedtime.   METAMUCIL PO Take 1 Dose by mouth daily. 1 dose = 4 teaspoons   metoprolol tartrate 25 MG tablet Commonly known as: LOPRESSOR Take 12.5 mg by mouth 2 (two) times daily.   multivitamin with minerals Tabs tablet Take 1 tablet by mouth daily.   Orgovyx 120 MG tablet Generic drug: relugolix Take 3 tablets one time and then take 1 tablet once daily thereafter   predniSONE 10 MG tablet Commonly known as: DELTASONE Take by mouth.   Trelegy Ellipta 100-62.5-25 MCG/ACT Aepb Generic drug: Fluticasone-Umeclidin-Vilant Take 1 puff by mouth daily.   Vitamin D (Ergocalciferol) 1.25 MG (50000 UNIT) Caps capsule Commonly known as: DRISDOL Take 50,000 Units by mouth every Saturday.        Allergies:  Allergies  Allergen Reactions   No Allergies On File     Family History: Family History  Problem Relation Age of Onset   Parkinson's disease Father     Social History:  reports that he quit smoking about 42 years ago. His smoking use included cigarettes. He has quit using smokeless tobacco.  His smokeless tobacco use included chew. He reports current alcohol use. He reports that he does not use drugs.   Physical Exam: BP (!) 160/82   Pulse (!) 56   Ht 5\' 10"  (1.778 m)   Wt 171 lb 4 oz (77.7 kg)   BMI 24.57 kg/m   Constitutional:  Alert and oriented, No acute distress. HEENT: [place] AT, moist mucus membranes.  Trachea midline, no masses. Neurologic: Grossly intact, no focal deficits, moving all 4  extremities. Psychiatric: Normal mood and affect.   Assessment & Plan:    1. Prostate cancer - Continue Orgovix until March 2025 as planned to reduce recurrence risk. - Monitor PSA levels; follow-up in six months.  2. Nocturia - No immediate intervention required; manage fluid intake before bedtime. - Consider Flomax if symptoms worsen, but currently not indicated  3. Hematochezia - Recommend colonoscopy to evaluate for potential causes of bleeding, including internal hemorrhoids or radiation colitis and most importantly, rule out underlying malignacny - Referral to Dr. Servando Snare, gastroenterologist, for further evaluation and management.  Return in about 6 months (around 12/07/2023) for repeat PSA.  I have reviewed the  above documentation for accuracy and completeness, and I agree with the above.   Vanna Scotland, MD   [facility] [address], [address] [place], [place] [address] [phone]

## 2023-06-08 NOTE — Progress Notes (Signed)
Radiation Oncology Follow up Note  Name: Corey Jimenez   Date:   06/08/2023 MRN:  [id number] DOB: 1945/11/05    This 77 y.o. male presents to the clinic today for 57-month follow-up status post IMRT radiation therapy to his prostate for stage IIc Gleason 8 (4+4) presenting with a PSA of 7.3.  REFERRING PROVIDER: Jaclyn Shaggy, MD  HPI: Patient is a 77 year old male now out 10 months having completed IMRT radiation therapy to his prostate for stage IIc Gleason 8 adenocarcinoma.  Seen today in follow-up he has been having some bright red blood per rectum.  He is on daily 81 mg of baby aspirin which I have asked him to discontinue for the next several weeks.  He specifically denies any increased lower urinary tract symptoms or significant diarrhea.  His most recent PSA is less than 0.1.  He is currently on ADT therapy through urology..  COMPLICATIONS OF TREATMENT: none  FOLLOW UP COMPLIANCE: keeps appointments   PHYSICAL EXAM:  BP (!) 145/76   Pulse (!) 52   Temp (!) 97 F (36.1 C)   Resp 16   Ht 5\' 10"  (1.778 m)   Wt 171 lb 4.8 oz (77.7 kg)   BMI 24.58 kg/m  Well-developed well-nourished patient in NAD. HEENT reveals PERLA, EOMI, discs not visualized.  Oral cavity is clear. No oral mucosal lesions are identified. Neck is clear without evidence of cervical or supraclavicular adenopathy. Lungs are clear to A&P. Cardiac examination is essentially unremarkable with regular rate and rhythm without murmur rub or thrill. Abdomen is benign with no organomegaly or masses noted. Motor sensory and DTR levels are equal and symmetric in the upper and lower extremities. Cranial nerves II through XII are grossly intact. Proprioception is intact. No peripheral adenopathy or edema is identified. No motor or sensory levels are noted. Crude visual fields are within normal range.  RADIOLOGY RESULTS: No current films for review  PLAN: Present time patient is doing well under excellent biochemical control of his  prostate cancer.  I have asked him to stop his baby aspirin for the next several weeks to see if that improves the rectal bleeding of also asked him to contact GI and schedule a colonic colonoscopy which he has not had for many years.  He sees Dr. Apolinar Junes in 6 months and continues on ADT therapy.  I will see him back in 1 year for follow-up.  Patient knows to call with any concerns.  I would like to take this opportunity to thank you for allowing me to participate in the care of your patient.Carmina Miller, MD

## 2023-06-14 ENCOUNTER — Telehealth: Payer: Self-pay | Admitting: Gastroenterology

## 2023-06-14 NOTE — Telephone Encounter (Signed)
Received incoming referral for the patient, I called the patient and scheduled an appointment. I sent out an appointment reminder with a No-Show letter. I contacted the patient to verify and update the information in his chart, including the insurance guarantor and other relevant details. Patient wants to have an colonoscopy done I explain to him due to his referral he will have to schedule and office visit.

## 2023-07-13 ENCOUNTER — Telehealth: Payer: Self-pay

## 2023-07-13 NOTE — Telephone Encounter (Signed)
Pt would like and earlier appt. due to having some bleeding. Per pt he can come to either location.

## 2023-07-14 ENCOUNTER — Other Ambulatory Visit: Payer: Self-pay

## 2023-07-14 ENCOUNTER — Encounter: Payer: Self-pay | Admitting: Gastroenterology

## 2023-07-14 ENCOUNTER — Ambulatory Visit: Payer: Medicare HMO | Admitting: Gastroenterology

## 2023-07-14 VITALS — BP 143/78 | HR 62 | Temp 97.9°F | Ht 69.5 in | Wt 173.0 lb

## 2023-07-14 DIAGNOSIS — Z8719 Personal history of other diseases of the digestive system: Secondary | ICD-10-CM | POA: Diagnosis not present

## 2023-07-14 DIAGNOSIS — K625 Hemorrhage of anus and rectum: Secondary | ICD-10-CM

## 2023-07-14 NOTE — Progress Notes (Signed)
Gastroenterology Consultation  Referring Provider:     Jaclyn Shaggy, MD Primary Care Physician:  Jaclyn Shaggy, MD Primary Gastroenterologist:  Dr. Servando Snare     Reason for Consultation:     Rectal bleeding        HPI:   Corey Jimenez is a 77 y.o. y/o male referred for consultation & management of rectal bleeding by Dr. Arlana Pouch, Jillene Bucks, MD. This patient comes in today after being referred by urology for rectal bleeding.  The patient has a history of prostate cancer and a past medical history significant for a MRI back in 2023 and a report of a perforated sigmoid colon.  It appears that the patient also has a past medical history of having a colonoscopy in the past by Dr. Lemar Livings back in 2014.  He carries the diagnosis of irritable bowel syndrome although he has not been seen by gastroenterologist in the recent past.  It appears that the patient also had hemorrhoid surgery back in 2012. The patient reports that he has had radiation to his prostate.  He denies any pain in the rectum with his bowel movements.  The patient states that sometimes he passes gas and there is only blood there.  There is no report of any abdominal pain.  The patient has been apprehensive to get another colonoscopy since his last colonoscopy was by Dr. Lemar Livings and was incomplete due to a perforation at the beginning of the procedure which was treated conservatively without a need for surgery.   Past Medical History:  Diagnosis Date   Asthma 08/31/1993   BPH (benign prostatic hyperplasia)    CAD (coronary artery disease) 11/03/2021   a.) NSTEMI 11/03/2021 --> LHC --> 30-40% mLAD, 90-95% LCx prior to the bifurcation of the AV groove circumflex and a large posterior lateral branch, minor luminal irregs (not > 20-30%) throughout all RCA distributions; PCI performed placing a 3.5 x 22 mm Medtronic Frontier DES x 1 to LCx   Chronic bronchitis (HCC)    Complication of anesthesia    had event during colonoscopy needed suction which  caused bruising to uvula   Elevated PSA    a.) PSA elevated at 7.3; b.) Prostate MRI 09/18/2021 --> volume 44 cm, posterior RIGHT mind gland lesion 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   Hemorrhoids    a.) s/p hemorrhoidectomy 2012   HLD (hyperlipidemia)    HTN (hypertension)    Irritable bowel disease    Long term current use of antithrombotics/antiplatelets    a.) on daily DAPT therapy (ASA + ticagrelor)   Mild hearing loss    NSTEMI (non-ST elevated myocardial infarction) (HCC) 11/03/2021   a.) TTE 11/03/2021: EF 55-60%, mild RVE, triv TR; b.) LHC -->  90-95% LCx prior to the bifurcation of the AV groove circumflex and a large posterior lateral branch --> PCI performed placing a 3.5 x 22 mm Medtronic Frontier DES x 1   Osteoarthritis    Perforation of sigmoid colon (HCC) 2014   Personal history of colonic polyps     Past Surgical History:  Procedure Laterality Date   BLEPHAROPLASTY     COLONOSCOPY  2008, 2011,2014   Dr Lemar Livings   CORONARY ANGIOPLASTY WITH STENT PLACEMENT Left 11/03/2021   Procedure: CORONARY ANGIOPLASTY WITH STENT PLACEMENT; Location: [facility]; Surgeon: Gilmer Mor, MD   HEMORRHOID SURGERY N/A 2012   PROSTATE BIOPSY N/A 04/16/2022   Procedure: PROSTATE BIOPSY  URO NAV;  Surgeon: Orson Ape,  MD;  Location: [facility];  Service: Urology;  Laterality: N/A;   TONSILLECTOMY Bilateral 1951    Prior to Admission medications   Medication Sig Start Date End Date Taking? Authorizing Provider  albuterol (PROVENTIL HFA;VENTOLIN HFA) 108 (90 Base) MCG/ACT inhaler Inhale 2 puffs into the lungs every 6 (six) hours as needed for wheezing or shortness of breath. 09/26/18   Johnna Acosta, NP  aspirin EC 81 MG tablet Take 81 mg by mouth daily. Swallow whole.    [provider]  atorvastatin (LIPITOR) 80 MG tablet Take 80 mg by mouth daily.    [provider]  doxycycline (VIBRAMYCIN) 100 MG capsule Take 100 mg by mouth 2 (two)  times daily. 06/01/23   [provider]  ezetimibe (ZETIA) 10 MG tablet Take 10 mg by mouth at bedtime.    [provider]  metoprolol tartrate (LOPRESSOR) 25 MG tablet Take 12.5 mg by mouth 2 (two) times daily.    [provider]  Multiple Vitamin (MULTIVITAMIN WITH MINERALS) TABS tablet Take 1 tablet by mouth daily.    [provider]  predniSONE (DELTASONE) 10 MG tablet Take by mouth. 06/01/23   [provider]  Psyllium (METAMUCIL PO) Take 1 Dose by mouth daily. 1 dose = 4 teaspoons    [provider]  relugolix (ORGOVYX) 120 MG tablet Take 3 tablets one time and then take 1 tablet once daily thereafter 01/06/23   Vanna Scotland, MD  TRELEGY ELLIPTA 100-62.5-25 MCG/ACT AEPB Take 1 puff by mouth daily. 06/02/23   [provider]  Vitamin D, Ergocalciferol, (DRISDOL) 1.25 MG (50000 UNIT) CAPS capsule Take 50,000 Units by mouth every Saturday. 10/17/21   [provider]    Family History  Problem Relation Age of Onset   Parkinson's disease Father      Social History   Tobacco Use   Smoking status: Former    Current packs/day: 0.00    Types: Cigarettes    Quit date: 08/31/1980    Years since quitting: 42.8   Smokeless tobacco: Former    Types: Designer, multimedia Use   Vaping status: Never Used  Substance Use Topics   Alcohol use: Yes    Comment: 3-4 liquor day   Drug use: No    Allergies as of 07/14/2023 - Review Complete 05/17/2023  Allergen Reaction Noted   No allergies on file  11/02/2021    Review of Systems:    All systems reviewed and negative except where noted in HPI.   Physical Exam:  There were no vitals taken for this visit. No LMP for male patient. General:   Alert,  Well-developed, well-nourished, pleasant and cooperative in NAD Head:  Normocephalic and atraumatic. Eyes:  Sclera clear, no icterus.   Conjunctiva pink. Ears:  Normal auditory acuity. Neck:  Supple; no masses or thyromegaly. Lungs:   Respirations even and unlabored.  Clear throughout to auscultation.   No wheezes, crackles, or rhonchi. No acute distress. Heart:  Regular rate and rhythm; no murmurs, clicks, rubs, or gallops. Abdomen:  Normal bowel sounds.  No bruits.  Soft, non-tender and non-distended without masses, hepatosplenomegaly or hernias noted.  No guarding or rebound tenderness.  Negative Carnett sign.   Rectal:  Deferred.  Pulses:  Normal pulses noted. Extremities:  No clubbing or edema.  No cyanosis. Neurologic:  Alert and oriented x3;  grossly normal neurologically. Skin:  Intact without significant lesions or rashes.  No jaundice. Lymph Nodes:  No significant cervical adenopathy. Psych:  Alert and cooperative. Normal mood and affect.  Imaging Studies: No results found.  Assessment and Plan:   Corey Jimenez is a 77 y.o. y/o male who comes in today with a history of rectal bleeding.  The patient has not had a colonoscopy in the past but had a attempted colonoscopy with a perforation in the sigmoid colon which was treated conservatively.  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  The patient has been encouraged to undergo a colonoscopy.  The patient has agreed to undergo a colonoscopy.  He will be set up for colonoscopy at the next available appointment.  The patient has been explained the plan and agrees with it.    Midge Minium, MD. Clementeen Graham    Note: This dictation was prepared with Dragon dictation along with smaller phrase technology. Any transcriptional errors that result from this process are unintentional.

## 2023-07-15 ENCOUNTER — Telehealth: Payer: Self-pay

## 2023-07-15 MED ORDER — NA SULFATE-K SULFATE-MG SULF 17.5-3.13-1.6 GM/177ML PO SOLN: 354.0000 mL | Freq: Once | ORAL | 0 refills | Status: AC

## 2023-07-15 NOTE — Telephone Encounter (Signed)
Patient called back again and states he is going out of town tomorrow and would like to pick this up today if possible. . Sent medication to [facility] drug

## 2023-07-15 NOTE — Telephone Encounter (Signed)
Patient wants to go on and pick up his prep for his colonoscopy that is scheduled on 07/27/2023 with Dr. Servando Snare. He states the prep is not at the pharmacy. He would like this called in for him today so he can pick this up tomorrow

## 2023-07-19 ENCOUNTER — Telehealth: Payer: Self-pay | Admitting: Gastroenterology

## 2023-07-19 NOTE — Telephone Encounter (Signed)
Patient called in about his prep for his procedure with Dr.Wohl on 07/27/23. He pharmacy is [facility] on [address], [place], [place] [address]. He will pick it up tomorrow.

## 2023-07-20 ENCOUNTER — Encounter: Payer: Self-pay | Admitting: Gastroenterology

## 2023-07-22 ENCOUNTER — Telehealth: Payer: Self-pay | Admitting: Gastroenterology

## 2023-07-22 NOTE — Telephone Encounter (Signed)
Pt would like a call back to has prep question for upcoming procedure

## 2023-07-22 NOTE — Telephone Encounter (Signed)
Pt wanted to confirm about taking the Metoprolol prior to procedure, nothing further needed

## 2023-07-26 ENCOUNTER — Encounter: Payer: Self-pay | Admitting: Gastroenterology

## 2023-07-27 ENCOUNTER — Ambulatory Visit: Payer: Medicare HMO | Admitting: Registered Nurse

## 2023-07-27 ENCOUNTER — Encounter: Payer: Self-pay | Admitting: Gastroenterology

## 2023-07-27 ENCOUNTER — Ambulatory Visit
Admission: RE | Admit: 2023-07-27 | Discharge: 2023-07-27 | Disposition: A | Discharge status: Home / Self Care | Payer: Medicare HMO | Attending: Gastroenterology | Admitting: Gastroenterology

## 2023-07-27 ENCOUNTER — Encounter
Admission: RE | Disposition: A | Discharge status: Home / Self Care | Payer: Self-pay | Source: Home / Self Care | Attending: Gastroenterology

## 2023-07-27 DIAGNOSIS — I10 Essential (primary) hypertension: Secondary | ICD-10-CM | POA: Insufficient documentation

## 2023-07-27 DIAGNOSIS — K6289 Other specified diseases of anus and rectum: Secondary | ICD-10-CM | POA: Insufficient documentation

## 2023-07-27 DIAGNOSIS — I2510 Atherosclerotic heart disease of native coronary artery without angina pectoris: Secondary | ICD-10-CM | POA: Insufficient documentation

## 2023-07-27 DIAGNOSIS — E785 Hyperlipidemia, unspecified: Secondary | ICD-10-CM | POA: Insufficient documentation

## 2023-07-27 DIAGNOSIS — Z79899 Other long term (current) drug therapy: Secondary | ICD-10-CM | POA: Diagnosis not present

## 2023-07-27 DIAGNOSIS — D124 Benign neoplasm of descending colon: Secondary | ICD-10-CM | POA: Diagnosis not present

## 2023-07-27 DIAGNOSIS — K921 Melena: Secondary | ICD-10-CM | POA: Diagnosis present

## 2023-07-27 DIAGNOSIS — Z87891 Personal history of nicotine dependence: Secondary | ICD-10-CM | POA: Insufficient documentation

## 2023-07-27 DIAGNOSIS — K635 Polyp of colon: Secondary | ICD-10-CM

## 2023-07-27 DIAGNOSIS — D120 Benign neoplasm of cecum: Secondary | ICD-10-CM | POA: Insufficient documentation

## 2023-07-27 DIAGNOSIS — K627 Radiation proctitis: Secondary | ICD-10-CM

## 2023-07-27 DIAGNOSIS — D122 Benign neoplasm of ascending colon: Secondary | ICD-10-CM | POA: Insufficient documentation

## 2023-07-27 DIAGNOSIS — D123 Benign neoplasm of transverse colon: Secondary | ICD-10-CM | POA: Insufficient documentation

## 2023-07-27 DIAGNOSIS — I252 Old myocardial infarction: Secondary | ICD-10-CM | POA: Diagnosis not present

## 2023-07-27 DIAGNOSIS — K559 Vascular disorder of intestine, unspecified: Secondary | ICD-10-CM

## 2023-07-27 DIAGNOSIS — K625 Hemorrhage of anus and rectum: Secondary | ICD-10-CM

## 2023-07-27 DIAGNOSIS — Z7902 Long term (current) use of antithrombotics/antiplatelets: Secondary | ICD-10-CM | POA: Diagnosis not present

## 2023-07-27 HISTORY — PX: COLONOSCOPY WITH PROPOFOL: SHX5780

## 2023-07-27 SURGERY — COLONOSCOPY WITH PROPOFOL
Anesthesia: General

## 2023-07-27 MED ORDER — PROPOFOL 10 MG/ML IV BOLUS
INTRAVENOUS | Status: AC
  Filled 2023-07-27: qty 20

## 2023-07-27 MED ORDER — SODIUM CHLORIDE 0.9 % IV SOLN: INTRAVENOUS | Status: DC

## 2023-07-27 MED ORDER — PROPOFOL 500 MG/50ML IV EMUL
INTRAVENOUS | Status: DC | PRN
  Administered 2023-07-27: 100 ug/kg/min via INTRAVENOUS

## 2023-07-27 MED ORDER — LIDOCAINE HCL (CARDIAC) PF 100 MG/5ML IV SOSY
PREFILLED_SYRINGE | INTRAVENOUS | Status: DC | PRN
  Administered 2023-07-27: 40 mg via INTRAVENOUS

## 2023-07-27 MED ORDER — PROPOFOL 1000 MG/100ML IV EMUL
INTRAVENOUS | Status: AC
  Filled 2023-07-27: qty 100

## 2023-07-27 MED ORDER — LIDOCAINE HCL (PF) 2 % IJ SOLN
INTRAMUSCULAR | Status: AC
  Filled 2023-07-27: qty 5

## 2023-07-27 MED ORDER — PROPOFOL 10 MG/ML IV BOLUS
INTRAVENOUS | Status: DC | PRN
  Administered 2023-07-27: 20 mg via INTRAVENOUS
  Administered 2023-07-27: 30 mg via INTRAVENOUS
  Administered 2023-07-27: 50 mg via INTRAVENOUS

## 2023-07-27 MED ORDER — PHENYLEPHRINE 80 MCG/ML (10ML) SYRINGE FOR IV PUSH (FOR BLOOD PRESSURE SUPPORT)
PREFILLED_SYRINGE | INTRAVENOUS | Status: AC
  Filled 2023-07-27: qty 10

## 2023-07-27 MED ORDER — PHENYLEPHRINE 80 MCG/ML (10ML) SYRINGE FOR IV PUSH (FOR BLOOD PRESSURE SUPPORT)
PREFILLED_SYRINGE | INTRAVENOUS | Status: DC | PRN
  Administered 2023-07-27: 80 ug via INTRAVENOUS

## 2023-07-27 NOTE — Anesthesia Preprocedure Evaluation (Signed)
Anesthesia Evaluation  Patient identified by MRN, date of birth, ID band Patient awake    Reviewed: Allergy & Precautions, H&P , NPO status , Patient's Chart, lab work & pertinent test results, reviewed documented beta blocker date and time   History of Anesthesia Complications (+) history of anesthetic complications  Airway Mallampati: II  TM Distance: >3 FB Neck ROM: full    Dental  (+) Dental Advidsory Given, Caps, Teeth Intact   Pulmonary neg shortness of breath, asthma , Continuous Positive Airway Pressure Ventilation , neg COPD, neg recent URI, former smoker   Pulmonary exam normal breath sounds clear to auscultation       Cardiovascular Exercise Tolerance: Good hypertension, (-) angina + CAD, + Past MI and + Cardiac Stents  (-) CABG Normal cardiovascular exam(-) dysrhythmias (-) Valvular Problems/Murmurs Rhythm:regular Rate:Normal     Neuro/Psych negative neurological ROS  negative psych ROS   GI/Hepatic negative GI ROS, Neg liver ROS,,,  Endo/Other  negative endocrine ROS    Renal/GU negative Renal ROS  negative genitourinary   Musculoskeletal   Abdominal   Peds  Hematology negative hematology ROS (+)   Anesthesia Other Findings Past Medical History: 08/31/1993: Asthma No date: BPH (benign prostatic hyperplasia) 11/03/2021: CAD (coronary artery disease)     Comment:  a.) NSTEMI 11/03/2021 --> LHC --> 30-40% mLAD, 90-95%               LCx prior to the bifurcation of the AV groove circumflex               and a large posterior lateral branch, minor luminal               irregs (not > 20-30%) throughout all RCA distributions;               PCI performed placing a 3.5 x 22 mm Medtronic Frontier               DES x 1 to LCx No date: Chronic bronchitis (HCC) No date: Complication of anesthesia     Comment:  had event during colonoscopy needed suction which caused              bruising to uvula No date:  Elevated PSA     Comment:  a.) PSA elevated at 7.3; b.) Prostate MRI 09/18/2021 -->              volume 44 cm, posterior RIGHT mind gland lesion               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 No date: Hemorrhoids     Comment:  a.) s/p hemorrhoidectomy 2012 No date: HLD (hyperlipidemia) No date: HTN (hypertension) No date: Irritable bowel disease No date: Long term current use of antithrombotics/antiplatelets     Comment:  a.) on daily DAPT therapy (ASA + ticagrelor) No date: Mild hearing loss 11/03/2021: NSTEMI (non-ST elevated myocardial infarction) (HCC)     Comment:  a.) TTE 11/03/2021: EF 55-60%, mild RVE, triv TR; b.)               LHC -->  90-95% LCx prior to the bifurcation of the AV               groove circumflex and a large posterior lateral branch               --> PCI performed placing a 3.5 x 22 mm Medtronic  Frontier DES x 1 No date: Osteoarthritis 2014: Perforation of sigmoid colon (HCC) No date: Personal history of colonic polyps   Reproductive/Obstetrics negative OB ROS                             Anesthesia Physical Anesthesia Plan  ASA: 2  Anesthesia Plan: General   Post-op Pain Management:    Induction: Intravenous  PONV Risk Score and Plan: 2 and Propofol infusion and TIVA  Airway Management Planned: Natural Airway and Nasal Cannula  Additional Equipment:   Intra-op Plan:   Post-operative Plan:   Informed Consent: I have reviewed the patients History and Physical, chart, labs and discussed the procedure including the risks, benefits and alternatives for the proposed anesthesia with the patient or authorized representative who has indicated his/her understanding and acceptance.     Dental Advisory Given  Plan Discussed with: Anesthesiologist, CRNA and Surgeon  Anesthesia Plan Comments:        Anesthesia Quick Evaluation

## 2023-07-27 NOTE — Transfer of Care (Signed)
Immediate Anesthesia Transfer of Care Note  Patient: Corey Jimenez  Procedure(s) Performed: COLONOSCOPY WITH PROPOFOL  Patient Location: PACU  Anesthesia Type:General  Level of Consciousness: drowsy and patient cooperative  Airway & Oxygen Therapy: Patient Spontanous Breathing  Post-op Assessment: Report given to RN and Post -op Vital signs reviewed and stable  Post vital signs: stable  Last Vitals:  Vitals Value Taken Time  BP 107/62 07/27/23 1129  Temp 36.2 C 07/27/23 1127  Pulse 56 07/27/23 1129  Resp 16 07/27/23 1129  SpO2 98 % 07/27/23 1129  Vitals shown include unfiled device data.  Last Pain:  Vitals:   07/27/23 1127  TempSrc: Temporal  PainSc: 0-No pain         Complications: No notable events documented.

## 2023-07-27 NOTE — Op Note (Signed)
[facility] Gastroenterology Patient Name: Menelik Kosik Procedure Date: 07/27/2023 10:36 AM MRN: [id number] Account #: [id number] Date of Birth: 04/01/1946 Admit Type: Outpatient Age: 77 Room: [facility] ENDO ROOM 4 Gender: Male Note Status: Finalized Instrument Name: Prentice Docker [id number] Procedure:             Colonoscopy Indications:           Hematochezia Providers:             Midge Minium MD, MD Medicines:             Propofol per Anesthesia Complications:         No immediate complications. Procedure:             Pre-Anesthesia Assessment:                        - Prior to the procedure, a History and Physical was                         performed, and patient medications and allergies were                         reviewed. The patient's tolerance of previous                         anesthesia was also reviewed. The risks and benefits                         of the procedure and the sedation options and risks                         were discussed with the patient. All questions were                         answered, and informed consent was obtained. Prior                         Anticoagulants: The patient has taken no anticoagulant                         or antiplatelet agents. ASA Grade Assessment: II - A                         patient with mild systemic disease. After reviewing                         the risks and benefits, the patient was deemed in                         satisfactory condition to undergo the procedure.                        After obtaining informed consent, the colonoscope was                         passed under direct vision. Throughout the procedure,                         the patient's blood pressure, pulse, and oxygen  saturations were monitored continuously. The                         Colonoscope was introduced through the anus and                         advanced to the the cecum, identified by appendiceal                          orifice and ileocecal valve. The colonoscopy was                         performed without difficulty. The patient tolerated                         the procedure well. The quality of the bowel                         preparation was excellent. Findings:      The perianal and digital rectal examinations were normal.      A 7 mm polyp was found in the transverse colon. The polyp was sessile.       The polyp was removed with a cold snare. Resection and retrieval were       complete.      A 4 mm polyp was found in the cecum. The polyp was sessile. The polyp       was removed with a cold snare. Resection and retrieval were complete.      Three sessile polyps were found in the ascending colon. The polyps were       2 to 5 mm in size. These polyps were removed with a cold snare.       Resection and retrieval were complete.      Two sessile polyps were found in the descending colon. The polyps were 4       to 5 mm in size. These polyps were removed with a cold snare. Resection       and retrieval were complete.      The mucosa vascular pattern in the rectum was diffusely increased.       Coagulation for hemostasis using argon plasma at 2 liters/minute and 20       watts was successful. Impression:            - One 7 mm polyp in the transverse colon, removed with                         a cold snare. Resected and retrieved.                        - One 4 mm polyp in the cecum, removed with a cold                         snare. Resected and retrieved.                        - Three 2 to 5 mm polyps in the ascending colon,                         removed with a cold snare.  Resected and retrieved.                        - Two 4 to 5 mm polyps in the descending colon,                         removed with a cold snare. Resected and retrieved.                        - Increased mucosa vascular pattern in the rectum.                         Treated with argon plasma coagulation  (APC). Recommendation:        - Discharge patient to home.                        - Resume previous diet.                        - Continue present medications.                        - Await pathology results. Procedure Code(s):     --- Professional ---                        [phone], 59, Colonoscopy, flexible; with control of                         bleeding, any method                        45385, Colonoscopy, flexible; with removal of                         tumor(s), polyp(s), or other lesion(s) by snare                         technique Diagnosis Code(s):     --- Professional ---                        K92.1, Melena (includes Hematochezia)                        D12.4, Benign neoplasm of descending colon                        D12.0, Benign neoplasm of cecum CPT copyright 2022 American Medical Association. All rights reserved. The codes documented in this report are preliminary and upon coder review may  be revised to meet current compliance requirements. Midge Minium MD, MD 07/27/2023 11:26:48 AM This report has been signed electronically. Number of Addenda: 0 Note Initiated On: 07/27/2023 10:36 AM Scope Withdrawal Time: 0 hours 16 minutes 21 seconds  Total Procedure Duration: 0 hours 27 minutes 44 seconds  Estimated Blood Loss:  Estimated blood loss: none.      [facility]

## 2023-07-27 NOTE — H&P (Signed)
Midge Minium, MD [facility] [address] [place], [place] [address] Phone:[phone] Fax : [phone]  Primary Care Physician:  Jaclyn Shaggy, MD Primary Gastroenterologist:  Dr. Servando Snare  Pre-Procedure History & Physical: HPI:  Corey Jimenez is a 77 y.o. male is here for an colonoscopy.   Past Medical History:  Diagnosis Date   Asthma 08/31/1993   BPH (benign prostatic hyperplasia)    CAD (coronary artery disease) 11/03/2021   a.) NSTEMI 11/03/2021 --> LHC --> 30-40% mLAD, 90-95% LCx prior to the bifurcation of the AV groove circumflex and a large posterior lateral branch, minor luminal irregs (not > 20-30%) throughout all RCA distributions; PCI performed placing a 3.5 x 22 mm Medtronic Frontier DES x 1 to LCx   Chronic bronchitis (HCC)    Complication of anesthesia    had event during colonoscopy needed suction which caused bruising to uvula   Elevated PSA    a.) PSA elevated at 7.3; b.) Prostate MRI 09/18/2021 --> volume 44 cm, posterior RIGHT mind gland lesion 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   Hemorrhoids    a.) s/p hemorrhoidectomy 2012   HLD (hyperlipidemia)    HTN (hypertension)    Irritable bowel disease    Long term current use of antithrombotics/antiplatelets    a.) on daily DAPT therapy (ASA + ticagrelor)   Mild hearing loss    NSTEMI (non-ST elevated myocardial infarction) (HCC) 11/03/2021   a.) TTE 11/03/2021: EF 55-60%, mild RVE, triv TR; b.) LHC -->  90-95% LCx prior to the bifurcation of the AV groove circumflex and a large posterior lateral branch --> PCI performed placing a 3.5 x 22 mm Medtronic Frontier DES x 1   Osteoarthritis    Perforation of sigmoid colon (HCC) 2014   Personal history of colonic polyps     Past Surgical History:  Procedure Laterality Date   BLEPHAROPLASTY     COLONOSCOPY  2008, 2011,2014   Dr Lemar Livings   CORONARY ANGIOPLASTY WITH STENT PLACEMENT Left 11/03/2021   Procedure: CORONARY ANGIOPLASTY WITH STENT  PLACEMENT; Location: [facility]; Surgeon: Gilmer Mor, MD   HEMORRHOID SURGERY N/A 2012   PROSTATE BIOPSY N/A 04/16/2022   Procedure: PROSTATE BIOPSY  URO NAV;  Surgeon: Orson Ape, MD;  Location: [facility];  Service: Urology;  Laterality: N/A;   TONSILLECTOMY Bilateral 1951    Prior to Admission medications   Medication Sig Start Date End Date Taking? Authorizing Provider  atorvastatin (LIPITOR) 80 MG tablet Take 80 mg by mouth daily.   Yes [provider]  ezetimibe (ZETIA) 10 MG tablet Take 10 mg by mouth at bedtime.   Yes [provider]  metoprolol tartrate (LOPRESSOR) 25 MG tablet Take 12.5 mg by mouth 2 (two) times daily.   Yes [provider]  Multiple Vitamin (MULTIVITAMIN WITH MINERALS) TABS tablet Take 1 tablet by mouth daily.   Yes [provider]  relugolix (ORGOVYX) 120 MG tablet Take 3 tablets one time and then take 1 tablet once daily thereafter 01/06/23  Yes Vanna Scotland, MD  Vitamin D, Ergocalciferol, (DRISDOL) 1.25 MG (50000 UNIT) CAPS capsule Take 50,000 Units by mouth every Saturday. 10/17/21  Yes [provider]  albuterol (PROVENTIL HFA;VENTOLIN HFA) 108 (90 Base) MCG/ACT inhaler Inhale 2 puffs into the lungs every 6 (six) hours as needed for wheezing or shortness of breath. 09/26/18   Scarboro, Coralee North, NP  Psyllium (METAMUCIL PO) Take 1 Dose by mouth daily. 1 dose = 4  teaspoons    [provider]  TRELEGY ELLIPTA 100-62.5-25 MCG/ACT AEPB Take 1 puff by mouth daily. 06/02/23   [provider]    Allergies as of 07/14/2023 - Review Complete 07/14/2023  Allergen Reaction Noted   No allergies on file  11/02/2021    Family History  Problem Relation Age of Onset   Parkinson's disease Father     Social History   Socioeconomic History   Marital status: Married    Spouse name: Vena Austria   Number of children: 3   Years of education: Not on file   Highest education level: Not on file   Occupational History   Not on file  Tobacco Use   Smoking status: Former    Current packs/day: 0.00    Types: Cigarettes    Quit date: 08/31/1980    Years since quitting: 42.9   Smokeless tobacco: Former    Types: Associate Professor status: Never Used  Substance and Sexual Activity   Alcohol use: Yes    Comment: 3-4 liquor day   Drug use: No   Sexual activity: Not on file  Other Topics Concern   Not on file  Social History Narrative   Not on file   Social Determinants of Health   Financial Resource Strain: Not on file  Food Insecurity: Not on file  Transportation Needs: Not on file  Physical Activity: Not on file  Stress: Not on file  Social Connections: Not on file  Intimate Partner Violence: Not on file    Review of Systems: See HPI, otherwise negative ROS  Physical Exam: BP (!) 141/74   Pulse 62   Temp (!) 97.3 F (36.3 C) (Temporal)   Resp 16   Wt 75.2 kg   SpO2 98%   BMI 24.13 kg/m  General:   Alert,  pleasant and cooperative in NAD Head:  Normocephalic and atraumatic. Neck:  Supple; no masses or thyromegaly. Lungs:  Clear throughout to auscultation.    Heart:  Regular rate and rhythm. Abdomen:  Soft, nontender and nondistended. Normal bowel sounds, without guarding, and without rebound.   Neurologic:  Alert and  oriented x4;  grossly normal neurologically.  Impression/Plan: Corey Jimenez is here for an colonoscopy to be performed for rectal bleeding  Risks, benefits, limitations, and alternatives regarding  colonoscopy have been reviewed with the patient.  Questions have been answered.  All parties agreeable.   Midge Minium, MD  07/27/2023, 9:57 AM

## 2023-07-30 LAB — SURGICAL PATHOLOGY

## 2023-07-31 ENCOUNTER — Encounter: Payer: Self-pay | Admitting: Gastroenterology

## 2023-08-02 ENCOUNTER — Ambulatory Visit: Payer: Medicare HMO | Admitting: Gastroenterology

## 2023-08-02 NOTE — Anesthesia Postprocedure Evaluation (Signed)
Anesthesia Post Note  Patient: Corey Jimenez  Procedure(s) Performed: COLONOSCOPY WITH PROPOFOL  Patient location during evaluation: Endoscopy Anesthesia Type: General Level of consciousness: awake and alert Pain management: pain level controlled Vital Signs Assessment: post-procedure vital signs reviewed and stable Respiratory status: spontaneous breathing, nonlabored ventilation, respiratory function stable and patient connected to nasal cannula oxygen Cardiovascular status: blood pressure returned to baseline and stable Postop Assessment: no apparent nausea or vomiting Anesthetic complications: no   No notable events documented.   Last Vitals:  Vitals:   07/27/23 1139 07/27/23 1150  BP: 121/69 137/72  Pulse: (!) 58 61  Resp: 15 (!) 23  Temp:    SpO2: 100%     Last Pain:  Vitals:   07/28/23 0800  TempSrc:   PainSc: 0-No pain                 Lenard Simmer

## 2023-12-06 ENCOUNTER — Other Ambulatory Visit: Payer: Self-pay

## 2023-12-08 ENCOUNTER — Ambulatory Visit: Payer: Self-pay | Admitting: Urology

## 2023-12-13 ENCOUNTER — Other Ambulatory Visit: Payer: Medicare HMO

## 2023-12-13 DIAGNOSIS — C61 Malignant neoplasm of prostate: Secondary | ICD-10-CM

## 2023-12-14 LAB — PSA: Prostate Specific Ag, Serum: <0.1 ng/mL (ref 0.0–4.0)

## 2023-12-15 ENCOUNTER — Ambulatory Visit: Payer: Medicare HMO | Admitting: Urology

## 2023-12-15 VITALS — BP 132/75 | HR 60

## 2023-12-15 DIAGNOSIS — Z8546 Personal history of malignant neoplasm of prostate: Secondary | ICD-10-CM

## 2023-12-15 NOTE — Progress Notes (Signed)
 Marcelle Overlie Plume,acting as a scribe for Vanna Scotland, MD.,have documented all relevant documentation on the behalf of Vanna Scotland, MD,as directed by  Vanna Scotland, MD while in the presence of Vanna Scotland, MD.  12/15/23 1:59 PM   Corey Jimenez 06/02/1946 [id number]  Referring provider: Jaclyn Shaggy, MD [address]   [place],  [place] [address]  No chief complaint on file.   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   He has undergone IMRT and a full course of ADT.   His PSA remains undetectable, and he is currently taking Orgovyx, experiencing a good amount of hot flashes. He reports no significant urinary symptoms, with only occasional urgency and frequency. He has had fewer episodes of blood in his stool and recently underwent a colonoscopy.   Dr. Servando Snare noted slight damage from radiation but no other pathology, which is reassuring. Corey Jimenez is doing well overall and is excited about the possibility of stopping his medication at this time.   PMH: Past Medical History:  Diagnosis Date   Asthma 08/31/1993   BPH (benign prostatic hyperplasia)    CAD (coronary artery disease) 11/03/2021   a.) NSTEMI 11/03/2021 --> LHC --> 30-40% mLAD, 90-95% LCx prior to the bifurcation of the AV groove circumflex and a large posterior lateral branch, minor luminal irregs (not > 20-30%) throughout all RCA distributions; PCI performed placing a 3.5 x 22 mm Medtronic Frontier DES x 1 to LCx   Chronic bronchitis (HCC)    Complication of anesthesia    had event during colonoscopy needed suction which caused bruising to uvula   Elevated PSA    a.) PSA elevated at 7.3; b.) Prostate MRI 09/18/2021 --> volume 44 cm, posterior RIGHT mind gland lesion 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   Hemorrhoids    a.) s/p hemorrhoidectomy 2012   HLD (hyperlipidemia)    HTN (hypertension)    Irritable bowel disease    Long term  current use of antithrombotics/antiplatelets    a.) on daily DAPT therapy (ASA + ticagrelor)   Mild hearing loss    NSTEMI (non-ST elevated myocardial infarction) (HCC) 11/03/2021   a.) TTE 11/03/2021: EF 55-60%, mild RVE, triv TR; b.) LHC -->  90-95% LCx prior to the bifurcation of the AV groove circumflex and a large posterior lateral branch --> PCI performed placing a 3.5 x 22 mm Medtronic Frontier DES x 1   Osteoarthritis    Perforation of sigmoid colon (HCC) 2014   Personal history of colonic polyps     Surgical History: Past Surgical History:  Procedure Laterality Date   BLEPHAROPLASTY     COLONOSCOPY  2008, 2011,2014   Dr Lemar Livings   COLONOSCOPY WITH PROPOFOL N/A 07/27/2023   Procedure: COLONOSCOPY WITH PROPOFOL;  Surgeon: Midge Minium, MD;  Location: [facility] ENDOSCOPY;  Service: Endoscopy;  Laterality: N/A;   CORONARY ANGIOPLASTY WITH STENT PLACEMENT Left 11/03/2021   Procedure: CORONARY ANGIOPLASTY WITH STENT PLACEMENT; Location: [facility]; Surgeon: Gilmer Mor, MD   HEMORRHOID SURGERY N/A 2012   PROSTATE BIOPSY N/A 04/16/2022   Procedure: PROSTATE BIOPSY  URO NAV;  Surgeon: Orson Ape, MD;  Location: [facility] ORS;  Service: Urology;  Laterality: N/A;   TONSILLECTOMY Bilateral 1951    Home Medications:  Allergies as of 12/15/2023       Reactions   No Allergies On File         Medication List  Accurate as of December 15, 2023  1:59 PM. If you have any questions, ask your nurse or doctor.          STOP taking these medications    albuterol 108 (90 Base) MCG/ACT inhaler Commonly known as: VENTOLIN HFA       TAKE these medications    atorvastatin 80 MG tablet Commonly known as: LIPITOR Take 80 mg by mouth daily.   ezetimibe 10 MG tablet Commonly known as: ZETIA Take 10 mg by mouth at bedtime.   METAMUCIL PO Take 1 Dose by mouth daily. 1 dose = 4 teaspoons   methylPREDNISolone 4 MG Tbpk tablet Commonly known as: MEDROL  DOSEPAK SMARTSIG:- Tablet(s) By Mouth -   metoprolol tartrate 25 MG tablet Commonly known as: LOPRESSOR Take 12.5 mg by mouth 2 (two) times daily.   multivitamin with minerals Tabs tablet Take 1 tablet by mouth daily.   Orgovyx 120 MG tablet Generic drug: relugolix Take 3 tablets one time and then take 1 tablet once daily thereafter   Trelegy Ellipta 100-62.5-25 MCG/ACT Aepb Generic drug: Fluticasone-Umeclidin-Vilant Take 1 puff by mouth daily.   Vitamin D (Ergocalciferol) 1.25 MG (50000 UNIT) Caps capsule Commonly known as: DRISDOL Take 50,000 Units by mouth every Saturday.        Allergies:  Allergies  Allergen Reactions   No Allergies On File     Family History: Family History  Problem Relation Age of Onset   Parkinson's disease Father     Social History:  reports that he quit smoking about 43 years ago. His smoking use included cigarettes. He has quit using smokeless tobacco.  His smokeless tobacco use included chew. He reports current alcohol use. He reports that he does not use drugs.   Physical Exam: BP 132/75   Pulse 60   Constitutional:  Alert and oriented, No acute distress. HEENT: [place] AT, moist mucus membranes.  Trachea midline, no masses. Neurologic: Grossly intact, no focal deficits, moving all 4 extremities. Psychiatric: Normal mood and affect.   Assessment & Plan:    1. Prostate Cancer, High-Risk - Corey Jimenez PSA remains undetectable, indicating effective management of his prostate cancer.  - He has completed a full course of IMRT and ADT. He is experiencing hot flashes as a side effect of Orgovyx, but these are manageable.  - He is excited to stop the medication at this time.  - We discussed the concept of a nadir and the definition of biochemical recurrence after radiation.  - Plan to recheck PSA in six months with lab only, and follow up in one year with another PSA for reevaluation.  2. Urinary Symptoms - Corey Jimenez reports occasional  urgency and frequency, but these symptoms are minimal and not significantly impacting his quality of life.  - No changes to the current management plan are necessary at this time.  3. Rectal Bleeding - Corey Jimenez is experiencing fewer episodes of blood in his stool.  - A recent colonoscopy showed no significant pathology, with Dr. Servando Snare attributing slight damage to previous radiation treatment. -  - This is reassuring, and no further intervention is required at this time.  Return in about 6 months (around 06/15/2024) for lab visit with PSA. Follow up in 1 year with office visit and PSA.   [facility] [address] [place], [place] [address] [phone]

## 2023-12-20 ENCOUNTER — Other Ambulatory Visit: Payer: Self-pay | Admitting: Urology

## 2023-12-20 DIAGNOSIS — C61 Malignant neoplasm of prostate: Secondary | ICD-10-CM

## 2023-12-20 NOTE — Telephone Encounter (Signed)
Patient stopped taking this medication

## 2023-12-20 NOTE — Telephone Encounter (Signed)
 Left detailed message for the patient to call us  back to let us  know if he stopped taking this medication? Per LOV note patient was going to stop

## 2024-01-25 ENCOUNTER — Telehealth: Payer: Self-pay | Admitting: Family Medicine

## 2024-01-25 NOTE — Telephone Encounter (Signed)
 Mr. Corey Jimenez was called and appointment scheduled   Blondine Hottel Sandrea Cruel, RN

## 2024-01-27 ENCOUNTER — Ambulatory Visit

## 2024-01-27 DIAGNOSIS — Z719 Counseling, unspecified: Secondary | ICD-10-CM

## 2024-01-27 DIAGNOSIS — Z23 Encounter for immunization: Secondary | ICD-10-CM

## 2024-01-27 NOTE — Progress Notes (Signed)
 In nurse clinic with wife for [employer] Comirnaty [phone] (63yrs+). Vaccine given and tolerated well. Updated [facility] copy given and reviewed. Turhan Chill, RN

## 2024-06-05 ENCOUNTER — Other Ambulatory Visit: Payer: Self-pay | Admitting: *Deleted

## 2024-06-05 ENCOUNTER — Other Ambulatory Visit: Payer: Self-pay

## 2024-06-05 ENCOUNTER — Inpatient Hospital Stay: Attending: Radiation Oncology

## 2024-06-05 DIAGNOSIS — C61 Malignant neoplasm of prostate: Secondary | ICD-10-CM

## 2024-06-05 DIAGNOSIS — Z8546 Personal history of malignant neoplasm of prostate: Secondary | ICD-10-CM | POA: Diagnosis present

## 2024-06-05 LAB — PSA: Prostatic Specific Antigen: 0.09 ng/mL (ref 0.00–4.00)

## 2024-06-12 ENCOUNTER — Other Ambulatory Visit: Payer: Medicare HMO

## 2024-06-12 ENCOUNTER — Ambulatory Visit
Admission: RE | Admit: 2024-06-12 | Discharge: 2024-06-12 | Disposition: A | Discharge status: Home / Self Care | Source: Ambulatory Visit | Attending: Radiation Oncology | Admitting: Radiation Oncology

## 2024-06-12 ENCOUNTER — Ambulatory Visit

## 2024-06-12 ENCOUNTER — Encounter: Payer: Self-pay | Admitting: Radiation Oncology

## 2024-06-12 VITALS — BP 130/74 | HR 49 | Temp 97.0°F | Resp 16 | Wt 175.0 lb

## 2024-06-12 DIAGNOSIS — Z923 Personal history of irradiation: Secondary | ICD-10-CM | POA: Insufficient documentation

## 2024-06-12 DIAGNOSIS — C61 Malignant neoplasm of prostate: Secondary | ICD-10-CM | POA: Diagnosis present

## 2024-06-12 DIAGNOSIS — Z719 Counseling, unspecified: Secondary | ICD-10-CM

## 2024-06-12 DIAGNOSIS — Z23 Encounter for immunization: Secondary | ICD-10-CM | POA: Diagnosis not present

## 2024-06-12 NOTE — Progress Notes (Signed)
 In nurse clinic requesting High dose flu and PCV 20 today. Both vaccines given and tolerated well. Updated [facility] copy given and reviewed. Cloma Rahrig, RN

## 2024-06-12 NOTE — Progress Notes (Signed)
 Radiation Oncology Follow up Note  Name: Corey Jimenez   Date:   06/12/2024 MRN:  [id number] DOB: Jun 21, 1946    This 78 y.o. male presents to the clinic today for 2-year follow-up status post IMRT radiation therapy to his prostate for stage IIc Gleason 8 (4+4) adenocarcinoma presenting with a PSA of 7.3.  REFERRING PROVIDER: Corlis Honor BROCKS, MD  HPI: Patient is a an 78 year old male now out 2 years having completed image guided IMRT radiation therapy to his prostate for Gleason 8 adenocarcinoma.  Seen today in routine follow-up he is doing well specifically denies any increased lower urinary tract symptoms diarrhea or fatigue.  He stopped his ADT therapy about 6 months ago.  His most recent PSA is 0.09 he recently changed doctors and is now being followed for up by GP down an [name] who is doing PSAs on him..  COMPLICATIONS OF TREATMENT: none  FOLLOW UP COMPLIANCE: keeps appointments   PHYSICAL EXAM:  BP 130/74   Pulse (!) 49   Temp (!) 97 F (36.1 C) (Tympanic)   Resp 16   Wt 175 lb (79.4 kg)   BMI 25.47 kg/m  Well-developed well-nourished patient in NAD. HEENT reveals PERLA, EOMI, discs not visualized.  Oral cavity is clear. No oral mucosal lesions are identified. Neck is clear without evidence of cervical or supraclavicular adenopathy. Lungs are clear to A&P. Cardiac examination is essentially unremarkable with regular rate and rhythm without murmur rub or thrill. Abdomen is benign with no organomegaly or masses noted. Motor sensory and DTR levels are equal and symmetric in the upper and lower extremities. Cranial nerves II through XII are grossly intact. Proprioception is intact. No peripheral adenopathy or edema is identified. No motor or sensory levels are noted. Crude visual fields are within normal range.  RADIOLOGY RESULTS: No current films for review  PLAN: Present time patient is now 2 years out still under excellent biochemical control of his prostate cancer.  He is spending a  lot of his time in [name] being followed by GP there.  I have asked them to run a PSA at least once a year on him.  I will you turn follow-up care over to them.  I be happy to reevaluate patient anytime should that be indicated such as arising in his PSA.  Patient knows to call with any concerns.  I would like to take this opportunity to thank you for allowing me to participate in the care of your patient.SABRA Marcey Penton, MD

## 2024-06-15 ENCOUNTER — Other Ambulatory Visit

## 2024-06-19 ENCOUNTER — Ambulatory Visit: Payer: Medicare HMO | Admitting: Radiation Oncology

## 2024-07-20 ENCOUNTER — Ambulatory Visit

## 2024-07-20 DIAGNOSIS — Z719 Counseling, unspecified: Secondary | ICD-10-CM

## 2024-07-20 DIAGNOSIS — Z23 Encounter for immunization: Secondary | ICD-10-CM | POA: Diagnosis not present

## 2024-07-20 NOTE — Progress Notes (Signed)
 In nurse clinic for immunization today-covid vaccine. Tolerated well to [place]. VIS given and copies of [facility].

## 2024-12-14 ENCOUNTER — Other Ambulatory Visit

## 2024-12-21 ENCOUNTER — Ambulatory Visit: Admitting: Urology
